# Patient Record
Sex: Female | Born: 1985 | Race: White | Hispanic: No | Smoking: Former smoker
Health system: Southern US, Community
[De-identification: ages and names within clinical notes are randomized; demographics above are authoritative.]

## PROBLEM LIST (undated history)

## (undated) DIAGNOSIS — F329 Major depressive disorder, single episode, unspecified: Secondary | ICD-10-CM

## (undated) DIAGNOSIS — F32A Depression, unspecified: Secondary | ICD-10-CM

## (undated) DIAGNOSIS — K819 Cholecystitis, unspecified: Secondary | ICD-10-CM

## (undated) DIAGNOSIS — N200 Calculus of kidney: Secondary | ICD-10-CM

## (undated) DIAGNOSIS — M1990 Unspecified osteoarthritis, unspecified site: Secondary | ICD-10-CM

## (undated) DIAGNOSIS — R519 Headache, unspecified: Secondary | ICD-10-CM

## (undated) DIAGNOSIS — F319 Bipolar disorder, unspecified: Secondary | ICD-10-CM

## (undated) DIAGNOSIS — M5430 Sciatica, unspecified side: Secondary | ICD-10-CM

## (undated) HISTORY — PX: BACK SURGERY: SHX140

## (undated) HISTORY — PX: ANKLE ARTHROSCOPY W/ OPEN REPAIR: SHX1145

## (undated) HISTORY — PX: OTHER SURGICAL HISTORY: SHX169

## (undated) HISTORY — PX: TUBAL LIGATION: SHX77

---
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
## 2004-02-09 ENCOUNTER — Inpatient Hospital Stay (HOSPITAL_COMMUNITY): Admission: AD | Admit: 2004-02-09 | Discharge: 2004-02-10 | Payer: Self-pay

## 2004-03-19 ENCOUNTER — Inpatient Hospital Stay (HOSPITAL_COMMUNITY): Admission: AD | Admit: 2004-03-19 | Discharge: 2004-03-19 | Payer: Self-pay | Admitting: Obstetrics and Gynecology

## 2004-03-20 ENCOUNTER — Inpatient Hospital Stay (HOSPITAL_COMMUNITY): Admission: AD | Admit: 2004-03-20 | Discharge: 2004-03-22 | Payer: Self-pay | Admitting: Obstetrics and Gynecology

## 2004-05-26 ENCOUNTER — Other Ambulatory Visit: Admission: RE | Admit: 2004-05-26 | Discharge: 2004-05-26 | Payer: Self-pay | Admitting: Obstetrics and Gynecology

## 2004-11-26 ENCOUNTER — Other Ambulatory Visit: Admission: RE | Admit: 2004-11-26 | Discharge: 2004-11-26 | Payer: Self-pay | Admitting: Obstetrics & Gynecology

## 2004-12-29 ENCOUNTER — Inpatient Hospital Stay (HOSPITAL_COMMUNITY): Admission: AD | Admit: 2004-12-29 | Discharge: 2005-01-04 | Payer: Self-pay | Admitting: Obstetrics and Gynecology

## 2005-01-02 ENCOUNTER — Encounter (INDEPENDENT_AMBULATORY_CARE_PROVIDER_SITE_OTHER): Payer: Self-pay | Admitting: Specialist

## 2008-06-25 ENCOUNTER — Inpatient Hospital Stay (HOSPITAL_COMMUNITY): Admission: AD | Admit: 2008-06-25 | Discharge: 2008-06-25 | Payer: Self-pay | Admitting: Obstetrics & Gynecology

## 2008-08-23 ENCOUNTER — Inpatient Hospital Stay (HOSPITAL_COMMUNITY): Admission: AD | Admit: 2008-08-23 | Discharge: 2008-08-24 | Payer: Self-pay | Admitting: Obstetrics and Gynecology

## 2008-09-15 ENCOUNTER — Encounter (INDEPENDENT_AMBULATORY_CARE_PROVIDER_SITE_OTHER): Payer: Self-pay | Admitting: Obstetrics and Gynecology

## 2008-09-15 ENCOUNTER — Inpatient Hospital Stay (HOSPITAL_COMMUNITY): Admission: AD | Admit: 2008-09-15 | Discharge: 2008-09-18 | Payer: Self-pay | Admitting: Obstetrics and Gynecology

## 2011-04-05 NOTE — Op Note (Signed)
NAMEROWENE, SUTO NO.:  [id number]   MEDICAL RECORD NO.:  [id number]          PATIENT TYPE:  INP   LOCATION:  9109                          FACILITY:  [facility]   PHYSICIAN:  Malva Limes, M.D.    DATE OF BIRTH:  1986-06-07   DATE OF PROCEDURE:  09/15/2008  DATE OF DISCHARGE:                               OPERATIVE REPORT   PREOPERATIVE DIAGNOSES:  1. Twin intrauterine pregnancy at 36-4/7th weeks.  2. Breech-breech presentation.  3. Premature rupture of membranes.  4. Abnormal alpha-fetoprotein with normal ultrasound.  5. The patient desires bilateral tubal ligation.   POSTOPERATIVE DIAGNOSES:  1. Twin intrauterine pregnancy at 36-4/7th weeks.  2. Breech-breech presentation.  3. Premature rupture of membranes.  4. Abnormal alpha-fetoprotein with normal ultrasound.  5. The patient desires bilateral tubal ligation.   PROCEDURES:  1. Primary low-transverse cesarean section.  2. Bilateral tubal ligation, Pomeroy procedure.   SURGEON:  Malva Limes, MD.   ANESTHESIA:  Spinal.   ANTIBIOTIC:  Ancef 1 g.   DRAINS:  Foley bedside drainage.   ESTIMATED BLOOD LOSS:  900 mL.   SPECIMENS:  1. Portion of right and left fallopian tube sent to Pathology.  2. Placenta sent to Pathology.   DESCRIPTION OF PROCEDURE:  The patient was taken to the operating room  where a spinal anesthetic was administered without difficulty.  She was  then placed in dorsal supine position with a left lateral tilt.  The  patient was prepped and draped in the usual fashion for this procedure.  A Pfannenstiel incision was made 2 cm above the pubic symphysis.  On  entering the abdominal cavity, the bladder flap was taken down with  sharp dissection.  A low-transverse uterine incision was made in the  midline with the Metzenbaum scissors.  Amniotic fluid was noted to be  clear.  Twin A was born in the breech presentation.  On delivery, the  head, the oropharynx, and nostrils were bulb  suctioned.  The cord was  doubly clamped and cut and the infant was handed to awaiting NICU team.  During these, the sac was then ruptured.  The fluid again was normal.  Twin B was delivered in the breech presentation.  On delivery of the  head, the oropharynx and nostrils were bulb suctioned.  The cord was  doubly clamped and cut and the infant was handed to waiting NICU team.  Placenta was then manually removed.  The uterus was exteriorized.  The  uterine cavity was wiped with a wet lap and examined.  The uterine  incision was closed in a single layer of 0 Monocryl in a running locking  fashion.  Bladder flap was closed with 2-0 Monocryl in a running  fashion.  At this point, the right fallopian tube was grasped in the  isthmic portion with a Babcock.  A 2-3-cm knuckle was doubly ligated  with 0 gut suture.  The knuckle was excised.  Both ostia were  visualized.  Hemostasis appeared to be adequate.  A similar procedure  was performed on the opposite side.  Uterus was then placed  back in the  abdominal cavity.  Hemostasis again was checked and found to be  adequate.  The parietal peritoneum and rectus muscles were  reapproximated in the midline using 2-0 Monocryl in a running fashion.  The fascia was closed using 0 Monocryl suture in a running fashion.  The  subcuticular tissue was made hemostatic with the Bovie.  Stainless steel  clips were used to close the skin.  The patient tolerated the procedure  well.  She was taken to recovery room in stable condition.  Instrument  counts were correct x2.           ______________________________  Malva Limes, M.D.     MA/MEDQ  D:  09/15/2008  T:  09/15/2008  Job:  [id number]
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
## 2011-04-08 NOTE — Discharge Summary (Signed)
[name], [name] NO.:  [id number]   MEDICAL RECORD NO.:  [id number]          PATIENT TYPE:  INP   LOCATION:  9109                          FACILITY:  [facility]   PHYSICIAN:  Jill Cardenas, M.D. DATE OF BIRTH:  May 31, 1986   DATE OF ADMISSION:  09/15/2008  DATE OF DISCHARGE:  09/18/2008                               DISCHARGE SUMMARY   FINAL DIAGNOSES:  Twin gestation at 36-4/7th weeks' gestation, breech-  breech presentation, preterm premature rupture of membranes, the patient  desires bilateral tubal ligation an abnormal alpha-fetoprotein during  her second trimester with a normal anatomy ultrasound.   PROCEDURE:  Primary low transverse cesarean section and bilateral tubal  ligation using the Pomeroy procedure.   SURGEON:  Malva Limes, MD   COMPLICATIONS:  None.   This 25 year old, G3, P1-1-0-2, presents at 36-4/7th weeks' gestation to  the [facility] complaining of preterm premature rupture of  membranes.  The patient's antepartum course was complicated by this  known twin gestation.  She did have a history of preterm labor and  preterm premature rupture of membranes with her last pregnancy, and  therefore had been on 17-hydroxyprogesterone this pregnancy starting  round 16 weeks' gestation.  The patient also had an abnormal AFP result  around 15 weeks' gestation, but did have a level II ultrasound of the  perinatal which showed normal anatomy.  Group B Strep status was unknown  at this time, and the patient was admitted for a cesarean section  secondary to a known breech-breech presentation.  The patient was taken  to the operating room on September 15, 2008, by Dr. Malva Limes where a  primary low transverse cesarean section was performed with the delivery  of twin A female infant weighing 5 pounds 0 ounces with Apgars of 8 and 9,  then delivery of twin B, I do not see the second twin's Apgars.  Procedure went without complications.  The patient  still expressed her  desire for permanent sterilization which was performed using the Pomeroy  procedure.  The patient's postoperative course was benign without any  significant fevers.  She did want the babies to be circumcised, but this  will take place later at the pediatric office.  By postoperative day #3,  the patient was felt ready for discharge.  She was sent home on a  regular diet, told to decrease activities, told to continue her vitamins  and her iron supplement, was given Percocet 1-2 every 4-6 hours as  needed for pain, was to follow up in our office in 4 weeks for her  postpartum check.  Instructions and precautions were reviewed with the  patient.   LABORATORY DATA ON DISCHARGE:  The patient had a hemoglobin of 9.2,  white blood cell count of 16.9, and platelets of 228,000.      Jill Cardenas, Jill Cardenas.      Jill Cardenas, M.D.  Electronically Signed    MB/MEDQ  D:  10/21/2008  T:  10/22/2008  Job:  [id number]

## 2011-04-08 NOTE — Discharge Summary (Signed)
Jill Cardenas, Jill Cardenas NO.:  [id number]   MEDICAL RECORD NO.:  [id number]          PATIENT TYPE:  INP   LOCATION:  9317                          FACILITY:  [facility]   PHYSICIAN:  Ilda Mori, M.D.   DATE OF BIRTH:  04/22/1986   DATE OF ADMISSION:  12/29/2004  DATE OF DISCHARGE:  01/04/2005                                 DISCHARGE SUMMARY   DISCHARGE DIAGNOSES:  1.  Intrauterine pregnancy at 33-1/7 weeks' gestation.  2.  Preterm premature rupture of membranes.  3.  Spontaneous vaginal delivery of a 4 pound 1-1/2 ounce female infant with      Apgar's of 7 and 9.   COMPLICATIONS:  None.   HISTORY OF PRESENT ILLNESS:  This 25 year old, G2, P1 presents at 33-1/7  weeks' gestation with preterm premature rupture of membranes.  This was  confirmed in maternity admissions.  The patient's antepartum course up to  this point had been uncomplicated.  A Group B Streptococcus culture was  obtained and started on penicillin for prophylaxis.  An ultrasound was  ordered for fetal position AFI.  Betamethasone protocol was begun.  The  patient was admitted to the hospital.  There was no evidence of labor.  She  was continued to be monitored for any signs of infection and the need to  induce labor.  The patient did receive her 2 days of betamethasone and her  Group B Streptococcus culture came back negative.  Ultrasound showed the  baby in vertex position.  The patient continued to be stable, but on  hospital day #5, the patient started to have contractions and feeling  crampy.  The exam was performed and the cervix was already 5 cm dilated and  -2 station.  She was contracting at this time every 2-3 minutes.  She was  transferred to labor and delivery for delivery.  She dilated to complete and  pushed for less than 15 minutes.  She had a spontaneous vaginal delivery of  a 4 pound 1-1/2 ounce female infant with Apgar's of 7 and 9.  Baby was brought  to the NICU secondary to gestational  age.  Delivery went without  complications.  The patient's postpartum course was benign without  significant fevers.  She did have an elevated white blood cell count.  The  baby was in the NICU, but was stable and the patient was felt ready for  discharge on postpartum day #2.  Her white blood cell count had dropped from  32,000 to 26,000 before discharge.   DIET:  She was sent home on a regular diet.   ACTIVITY:  Decrease activities.   SPECIAL INSTRUCTIONS:  She was told to continue her Vitamins.  She was to  call with questions, increased bleeding, pain or temperatures.   DISCHARGE MEDICATIONS:  1.  Motrin 600 mg one every 6 hours as needed for pain.  2.  Tylenol 650 mg every 4-6 hours as needed for pain.   FOLLOW UP:  Follow up in the office in 4-6 weeks.   DISCHARGE LABORATORY DATA AND X-RAY FINDINGS:  The patient had  a hemoglobin  of 11.7, white blood cell count 26,000.      MB/MEDQ  D:  02/02/2005  T:  02/02/2005  Job:  [id number]

## 2011-04-08 NOTE — Op Note (Signed)
NAMEMARDEL, GRUDZIEN NO.:  [id number]   MEDICAL RECORD NO.:  [id number]                   PATIENT TYPE:  INP   LOCATION:  2550                                 FACILITY:  [facility]   PHYSICIAN:  Rodney A. Chaney Malling, M.D.           DATE OF BIRTH:  1986-03-25   DATE OF PROCEDURE:  07/01/2003  DATE OF DISCHARGE:                                 OPERATIVE REPORT   PREOPERATIVE DIAGNOSIS:  Severely displaced bimalleolar fracture, right  ankle.   POSTOPERATIVE DIAGNOSIS:  Severely displaced bimalleolar fracture, right  ankle.   OPERATION:  Open reduction and internal fixation of bimalleolar fracture,  right ankle, using two cannulated screws, medial malleolus, and six-hole  side plate with 4 mm cortical screws, lateral malleolus, and AlloMatrix  injectable bone putty.   ANESTHESIA:  General.   SURGEON:  Rodney A. Chaney Malling, M.D.   ASSISTANT:  Richardean Canal, P.A.   DESCRIPTION OF PROCEDURE:  Patient placed on the operating table in supine  position with a pneumatic tourniquet about the right upper thigh.  After  satisfactory general anesthesia a bump was placed under the right hip.  The  right lower extremity was prepped with Duraprep and draped out in the usual  manner.  The leg and foot were then wrapped out with an Esmarch, the  tourniquet elevated.  An incision was made over the lateral malleolus  starting about 45 cm proximal to the fracture site and carried down to the  tip of the medial malleolus.  Dissection was carried down to the fibula.  Soft tissue was stripped off.  The fracture site was clearly seen.  There  were fragments of bone missing.  The fracture was then reduced and brought  out to length and a six-hole side plate was put in position with bone clamps  to hold this in place, and appropriate-length screws were applied.  Excellent reduction of the fracture of the fibula was achieved.  Since there  was a defect at the fracture site,  AlloMatrix injectable bone putty was  injected about the fracture site.   Attention then turned to the medial malleolus.  An incision made over the  medial malleolus from proximal to distal.  A self-retaining retractor was  put in position.  The fracture site was clearly seen.  The posterior tibial  tendon was seen, and this was protected.  The medial malleolus was reduced  in anatomic position, and two small guide pins were placed across the  fracture site to stabilize the fracture very nicely.  Throughout the  procedure.  This was checked with C-arm.  A cannulated reamer was placed  over the guide pin and drill holes were made, and the depth of the holes was  measured with a guide.  A 50 mm cannulated screw was then placed over the  guide pins, passed across the fracture into the distal tibia.  Again this  was  all checked with radiograph and anatomic reduction of the medial  malleolus was achieved.  Excellent fixation was also achieved.  __________  seated nicely in the mortise with no varus or valgus tilt.  Subcutaneous  tissue was then closed with 2-0 Vicryl, skin closed with stainless steel  staples.  A large bulky compression and a sugar tong cast applied.  The  patient returned to the recovery room in excellent condition.  Technically  this went extremely well.    DRAINS:  None.   COMPLICATIONS:  None.                                               Rodney A. Chaney Malling, M.D.    RAM/MEDQ  D:  07/01/2003  T:  07/02/2003  Job:  [id number]

## 2011-08-22 LAB — URINE MICROSCOPIC-ADD ON

## 2011-08-22 LAB — CBC
HCT: 33 — ABNORMAL LOW
MCHC: 33.3
MCHC: 33.9
Platelets: 228
Platelets: 278
RBC: 3.16 — ABNORMAL LOW
RDW: 13.5
WBC: 16.9 — ABNORMAL HIGH

## 2011-08-22 LAB — URINALYSIS, ROUTINE W REFLEX MICROSCOPIC
Glucose, UA: NEGATIVE
pH: 5.5

## 2013-01-01 ENCOUNTER — Encounter (HOSPITAL_COMMUNITY): Payer: Self-pay | Admitting: *Deleted

## 2013-01-01 ENCOUNTER — Emergency Department (HOSPITAL_COMMUNITY)
Admission: EM | Admit: 2013-01-01 | Discharge: 2013-01-01 | Disposition: A | Discharge status: Home / Self Care | Payer: Self-pay | Attending: Emergency Medicine | Admitting: Emergency Medicine

## 2013-01-01 ENCOUNTER — Emergency Department (HOSPITAL_COMMUNITY): Payer: Self-pay

## 2013-01-01 DIAGNOSIS — R319 Hematuria, unspecified: Secondary | ICD-10-CM | POA: Insufficient documentation

## 2013-01-01 DIAGNOSIS — F17200 Nicotine dependence, unspecified, uncomplicated: Secondary | ICD-10-CM | POA: Insufficient documentation

## 2013-01-01 DIAGNOSIS — Z3202 Encounter for pregnancy test, result negative: Secondary | ICD-10-CM | POA: Insufficient documentation

## 2013-01-01 DIAGNOSIS — N200 Calculus of kidney: Secondary | ICD-10-CM | POA: Insufficient documentation

## 2013-01-01 DIAGNOSIS — M25519 Pain in unspecified shoulder: Secondary | ICD-10-CM | POA: Insufficient documentation

## 2013-01-01 DIAGNOSIS — N289 Disorder of kidney and ureter, unspecified: Secondary | ICD-10-CM

## 2013-01-01 DIAGNOSIS — R1011 Right upper quadrant pain: Secondary | ICD-10-CM | POA: Insufficient documentation

## 2013-01-01 DIAGNOSIS — R109 Unspecified abdominal pain: Secondary | ICD-10-CM

## 2013-01-01 DIAGNOSIS — R110 Nausea: Secondary | ICD-10-CM | POA: Insufficient documentation

## 2013-01-01 HISTORY — DX: Cholecystitis, unspecified: K81.9

## 2013-01-01 LAB — CBC WITH DIFFERENTIAL/PLATELET
Basophils Absolute: 0 10*3/uL (ref 0.0–0.1)
Eosinophils Relative: 2 % (ref 0–5)
Lymphocytes Relative: 24 % (ref 12–46)
Lymphs Abs: 2.9 10*3/uL (ref 0.7–4.0)
MCV: 87.4 fL (ref 78.0–100.0)
Neutro Abs: 8.3 10*3/uL — ABNORMAL HIGH (ref 1.7–7.7)
Neutrophils Relative %: 68 % (ref 43–77)
Platelets: 300 10*3/uL (ref 150–400)
RBC: 4.85 MIL/uL (ref 3.87–5.11)
WBC: 12.2 10*3/uL — ABNORMAL HIGH (ref 4.0–10.5)

## 2013-01-01 LAB — URINE MICROSCOPIC-ADD ON

## 2013-01-01 LAB — COMPREHENSIVE METABOLIC PANEL
ALT: 10 U/L (ref 0–35)
AST: 16 U/L (ref 0–37)
Alkaline Phosphatase: 83 U/L (ref 39–117)
CO2: 23 mEq/L (ref 19–32)
Chloride: 103 mEq/L (ref 96–112)
GFR calc Af Amer: >90 mL/min (ref >90)
GFR calc non Af Amer: >90 mL/min (ref >90)
Glucose, Bld: 91 mg/dL (ref 70–99)
Potassium: 3.9 mEq/L (ref 3.5–5.1)
Sodium: 139 mEq/L (ref 135–145)
Total Bilirubin: 0.4 mg/dL (ref 0.3–1.2)

## 2013-01-01 LAB — URINALYSIS, ROUTINE W REFLEX MICROSCOPIC
Bilirubin Urine: NEGATIVE
Glucose, UA: NEGATIVE mg/dL
Protein, ur: NEGATIVE mg/dL
Urobilinogen, UA: 1 mg/dL (ref 0.0–1.0)

## 2013-01-01 LAB — PREGNANCY, URINE: Preg Test, Ur: NEGATIVE

## 2013-01-01 MED ORDER — CEPHALEXIN 500 MG PO CAPS: 500.0000 mg | ORAL_CAPSULE | Freq: Three times a day (TID) | ORAL | Status: DC

## 2013-01-01 MED ORDER — ONDANSETRON HCL 4 MG/2ML IJ SOLN
4.0000 mg | Freq: Once | INTRAMUSCULAR | Status: AC
  Administered 2013-01-01: 4 mg via INTRAVENOUS
  Filled 2013-01-01: qty 2

## 2013-01-01 MED ORDER — HYDROMORPHONE HCL PF 1 MG/ML IJ SOLN
1.0000 mg | Freq: Once | INTRAMUSCULAR | Status: AC
  Administered 2013-01-01: 1 mg via INTRAVENOUS
  Filled 2013-01-01: qty 1

## 2013-01-01 MED ORDER — ONDANSETRON 8 MG PO TBDP: 8.0000 mg | ORAL_TABLET | Freq: Three times a day (TID) | ORAL | Status: DC | PRN

## 2013-01-01 MED ORDER — OXYCODONE-ACETAMINOPHEN 5-325 MG PO TABS: 1.0000 | ORAL_TABLET | ORAL | Status: DC | PRN

## 2013-01-01 NOTE — ED Notes (Signed)
I gave the patient a half a cup of ice. 

## 2013-01-01 NOTE — ED Notes (Signed)
I gave the patient another warm blanket. 

## 2013-01-01 NOTE — ED Notes (Signed)
Pt up ambulatory to the bathroom at this time 

## 2013-01-01 NOTE — ED Provider Notes (Signed)
History     CSN: [id number]  Arrival date & time 01/01/13  1150   First MD Initiated Contact with Patient 01/01/13 1340      Chief Complaint  Patient presents with  . Abdominal Pain    (Consider location/radiation/quality/duration/timing/severity/associated sxs/prior treatment) HPI Patient with ruq pain and right shoulder pain 5 days.  Shoulder pain constant, stomach is pressure.  Nausea, no vomiting.  Worsens with eating, sitting up.  Some chills, no fever. No meds daily.  Took dramamine Friday with some help with nausea.  LMP 2 days ago, nml, no bcp.  Bilateral tubal ligation.  g3p4 Pain 7-8.  Past Medical History  Diagnosis Date  . Cholecystitis     Past Surgical History  Procedure Laterality Date  . Cesarian    . Ankle arthroscopy w/ open repair      No family history on file.  History  Substance Use Topics  . Smoking status: Current Every Day Smoker -- 1.00 packs/day    Types: Cigarettes  . Smokeless tobacco: Not on file  . Alcohol Use: No    OB History   Grav Para Term Preterm Abortions TAB SAB Ect Mult Living                  Review of Systems  All other systems reviewed and are negative.    Allergies  Latex  Home Medications  No current outpatient prescriptions on file.  BP 114/76  Pulse 83  Temp(Src) 98.2 F (36.8 C) (Oral)  Resp 20  SpO2 97%  LMP 12/30/2012  Physical Exam  Nursing note and vitals reviewed. Constitutional: She appears well-developed and well-nourished.  HENT:  Head: Normocephalic and atraumatic.  Eyes: Conjunctivae and EOM are normal. Pupils are equal, round, and reactive to light.  Neck: Normal range of motion. Neck supple.  Cardiovascular: Normal rate, regular rhythm, normal heart sounds and intact distal pulses.   Pulmonary/Chest: Effort normal and breath sounds normal.  Abdominal: Soft. Bowel sounds are normal. There is tenderness.  Tender ruq  Musculoskeletal: Normal range of motion.  Neurological: She is alert.   Skin: Skin is warm and dry.  Psychiatric: She has a normal mood and affect. Thought content normal.    ED Course  Procedures (including critical care time)  Labs Reviewed  CBC WITH DIFFERENTIAL - Abnormal; Notable for the following:    WBC 12.2 (*)    Neutro Abs 8.3 (*)    All other components within normal limits  URINALYSIS, ROUTINE W REFLEX MICROSCOPIC - Abnormal; Notable for the following:    APPearance CLOUDY (*)    Hgb urine dipstick LARGE (*)    All other components within normal limits  URINE MICROSCOPIC-ADD ON - Abnormal; Notable for the following:    Squamous Epithelial / LPF FEW (*)    All other components within normal limits  COMPREHENSIVE METABOLIC PANEL  LIPASE, BLOOD   No results found.   No diagnosis found.  Results for orders placed during the hospital encounter of 01/01/13  CBC WITH DIFFERENTIAL      Result Value Range   WBC 12.2 (*) 4.0 - 10.5 K/uL   RBC 4.85  3.87 - 5.11 MIL/uL   Hemoglobin 14.3  12.0 - 15.0 g/dL   HCT 13.0  86.5 - 78.4 %   MCV 87.4  78.0 - 100.0 fL   MCH 29.5  26.0 - 34.0 pg   MCHC 33.7  30.0 - 36.0 g/dL   RDW 69.6  29.5 - 28.4 %  Platelets 300  150 - 400 K/uL   Neutrophils Relative 68  43 - 77 %   Neutro Abs 8.3 (*) 1.7 - 7.7 K/uL   Lymphocytes Relative 24  12 - 46 %   Lymphs Abs 2.9  0.7 - 4.0 K/uL   Monocytes Relative 7  3 - 12 %   Monocytes Absolute 0.8  0.1 - 1.0 K/uL   Eosinophils Relative 2  0 - 5 %   Eosinophils Absolute 0.2  0.0 - 0.7 K/uL   Basophils Relative 0  0 - 1 %   Basophils Absolute 0.0  0.0 - 0.1 K/uL  COMPREHENSIVE METABOLIC PANEL      Result Value Range   Sodium 139  135 - 145 mEq/L   Potassium 3.9  3.5 - 5.1 mEq/L   Chloride 103  96 - 112 mEq/L   CO2 23  19 - 32 mEq/L   Glucose, Bld 91  70 - 99 mg/dL   BUN 10  6 - 23 mg/dL   Creatinine, Ser 7.84  0.50 - 1.10 mg/dL   Calcium 9.9  8.4 - 69.6 mg/dL   Total Protein 7.5  6.0 - 8.3 g/dL   Albumin 4.4  3.5 - 5.2 g/dL   AST 16  0 - 37 U/L   ALT 10  0  - 35 U/L   Alkaline Phosphatase 83  39 - 117 U/L   Total Bilirubin 0.4  0.3 - 1.2 mg/dL   GFR calc non Af Amer >90  >90 mL/min   GFR calc Af Amer >90  >90 mL/min  LIPASE, BLOOD      Result Value Range   Lipase 26  11 - 59 U/L  URINALYSIS, ROUTINE W REFLEX MICROSCOPIC      Result Value Range   Color, Urine YELLOW  YELLOW   APPearance CLOUDY (*) CLEAR   Specific Gravity, Urine 1.012  1.005 - 1.030   pH 6.0  5.0 - 8.0   Glucose, UA NEGATIVE  NEGATIVE mg/dL   Hgb urine dipstick LARGE (*) NEGATIVE   Bilirubin Urine NEGATIVE  NEGATIVE   Ketones, ur NEGATIVE  NEGATIVE mg/dL   Protein, ur NEGATIVE  NEGATIVE mg/dL   Urobilinogen, UA 1.0  0.0 - 1.0 mg/dL   Nitrite NEGATIVE  NEGATIVE   Leukocytes, UA NEGATIVE  NEGATIVE  URINE MICROSCOPIC-ADD ON      Result Value Range   Squamous Epithelial / LPF FEW (*) RARE   WBC, UA 0-2  <3 WBC/hpf   RBC / HPF 21-50  <3 RBC/hpf   Bacteria, UA RARE  RARE  PREGNANCY, URINE      Result Value Range   Preg Test, Ur NEGATIVE  NEGATIVE   Ct Abdomen Pelvis Wo Contrast  01/01/2013  *RADIOLOGY REPORT*  Clinical Data: Right abdominal pain radiating to the right shoulder.  Nausea and belching.  CT ABDOMEN AND PELVIS WITHOUT CONTRAST  Technique:  Multidetector CT imaging of the abdomen and pelvis was performed following the standard protocol without intravenous contrast.  Comparison: 01/01/2013  Findings: 0.6 cm hypodense lesion in segment 2 of the liver, probably a cyst.  Spleen, adrenal glands, and pancreas unremarkable noncontrast CT appearance. The gallbladder and biliary system appear unremarkable.  A 3 mm left kidney lower pole nonobstructive calculus observed. Possible 1 mm right kidney upper pole nonobstructive calculus. Subtle 8 mm hypodense lesion inferiorly in the right kidney, quite likely a cyst but technically nonspecific. No compelling CT findings of medullary nephrocalcinosis.  No hydronephrosis or  hydroureter.  No ureteral calculus or bladder calculus.   No pathologic retroperitoneal or porta hepatis adenopathy is identified.  No pathologic pelvic adenopathy is identified.  Uterine and ovarian contours unremarkable.  No free pelvic fluid. Enchondroma noted proximally in the left femur.  Transitional L5 vertebra noted.  Left paracentral disc protrusion suspected at L4- 5.  Appendix normal.  No perihepatic ascites or definite lesion along the right hemidiaphragm.  IMPRESSION:  1.  Nonobstructive 3 mm left kidney lower pole calculus.  No compelling CT findings for medullary nephrocalcinosis. 2.  Suspected 1 mm right kidney upper pole nonobstructive calculus. 3.  8 mm hypodense lesion in the right kidney lower pole is of uncertain significance.  It is not readily apparent on the prior ultrasound, and is only very faintly seen on today's CT scan. Conceivably this could represent a focus of inflammation. Correlate with urine analysis.  CT or MRI using dedicated renal protocol with contrast utilized for further characterization. 4.  Left paracentral disc protrusion at L4-5.  Please note the L5 vertebra is transitional.   Original Report Authenticated By: Gaylyn Rong, M.D.    US Abdomen Complete  01/01/2013  *RADIOLOGY REPORT*  Clinical Data:  Abdominal pain  ABDOMINAL ULTRASOUND COMPLETE  Comparison:  None.  Findings:  Gallbladder:  No gallstones, gallbladder wall thickening, or pericholecystic fluid. The sonographic Murphy's sign is negative.  Common Bile Duct:  Within normal limits in caliber. Measures 4.9 mm.  Liver: No focal mass lesion identified.  Within normal limits in parenchymal echogenicity.  IVC:  Appears normal.  Pancreas:  No abnormality identified.  Spleen:  Within normal limits in size and echotexture. Measures 9.4 cm in sagittal length.  Right kidney:  Normal in size.  Measures 11.5 cm in sagittal length.  There is increased echogenicity of the medullary pyramids throughout the right kidney.  There is no hydronephrosis or renal mass.  Left kidney:   Normal in size.  Measures 12.4 cm and sagittal length.  Increased echogenicity of the medullary pyramids throughout the left kidney.  3 mm hyperechoic structure with some shadowing in the lower pole of the left kidney is likely a small stone.  Abdominal Aorta:  No aneurysm identified.  IMPRESSION:  1.  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  No prior imaging studies of the kidneys are available for comparison.  Potential etiologies for medullary nephrocalcinosis include medullary sponge kidney, hyperparathyroidism, and renal tubular acidosis. Consider follow-up with nephrology. 2.  Probable nonobstructing 3 mm stone lower pole left kidney. 3.  The remainder of the abdominal ultrasound is within normal limits.   Original Report Authenticated By: Britta Mccreedy, M.D.   Patient with ruq pain without evidence of cholelithiasis or right ureteral colic.  Paient with hematuria but also menstruating and urine was clean catch.  Pain improved here with pain meds.  Patient does have left lower pole stone.  Plan antibiotics and pain meds.  Patient advised close follow up.  Discusse ct results of disc and kidney lesion.   MDM          Hilario Quarry, MD 01/01/13 [phone]

## 2013-01-01 NOTE — ED Notes (Signed)
I gave the patient a warm blanket. 

## 2013-01-01 NOTE — ED Notes (Signed)
This summer pt was dx with cholecystitis.  Last night she began experiencing RUQ pain that radiates to her R shoulder, pain increases esp after she eats.  Pt also c/o nausea and belching.

## 2013-01-01 NOTE — Discharge Instructions (Signed)
°  You have no evidence of gallstones.  You have kidney stones in both kidneys and blood in your urine.  Please take all antibiotics as prescribed and recheck with your doctor for follow up.  You also have an abnormality of the right kidney which should be assessed by your doctor and may need follow up with a specialist.   Abdominal Pain Many things can cause belly (abdominal) pain. Most times, the belly pain is not dangerous. The amount of belly pain does not tell how serious the problem may be. Many cases of belly pain can be watched and treated at home. HOME CARE   Do not take medicines that help you go poop (laxatives) unless told to by your doctor.  Only take medicine as told by your doctor.  Eat or drink as told by your doctor. Your doctor will tell you if you should be on a special diet. GET HELP RIGHT AWAY IF:   The pain does not go away.  You have a fever.  You keep throwing up (vomiting).  The pain changes and is only in the right or left part of the belly.  You have bloody or tarry looking poop. MAKE SURE YOU:   Understand these instructions.  Will watch your condition.  Will get help right away if you are not doing well or get worse. Document Released: 04/25/2008 Document Revised: 01/30/2012 Document Reviewed: 11/23/2009 [facility] Patient Information 2013 [place], [place].

## 2013-01-01 NOTE — ED Notes (Signed)
Pt c/o nausea Ray MD notified. zofran and 1/2 cup of ice chips given

## 2014-11-03 ENCOUNTER — Emergency Department (HOSPITAL_COMMUNITY)
Admission: EM | Admit: 2014-11-03 | Discharge: 2014-11-04 | Disposition: A | Discharge status: Home / Self Care | Payer: Medicaid Other | Attending: Emergency Medicine | Admitting: Emergency Medicine

## 2014-11-03 ENCOUNTER — Encounter (HOSPITAL_COMMUNITY): Payer: Self-pay | Admitting: Physical Medicine and Rehabilitation

## 2014-11-03 ENCOUNTER — Emergency Department (HOSPITAL_COMMUNITY): Payer: Medicaid Other

## 2014-11-03 DIAGNOSIS — M5116 Intervertebral disc disorders with radiculopathy, lumbar region: Secondary | ICD-10-CM | POA: Insufficient documentation

## 2014-11-03 DIAGNOSIS — R29898 Other symptoms and signs involving the musculoskeletal system: Secondary | ICD-10-CM

## 2014-11-03 DIAGNOSIS — Z79899 Other long term (current) drug therapy: Secondary | ICD-10-CM | POA: Insufficient documentation

## 2014-11-03 DIAGNOSIS — Z91040 Latex allergy status: Secondary | ICD-10-CM | POA: Insufficient documentation

## 2014-11-03 DIAGNOSIS — R000 Tachycardia, unspecified: Secondary | ICD-10-CM | POA: Insufficient documentation

## 2014-11-03 DIAGNOSIS — Z8719 Personal history of other diseases of the digestive system: Secondary | ICD-10-CM | POA: Diagnosis not present

## 2014-11-03 DIAGNOSIS — Z3202 Encounter for pregnancy test, result negative: Secondary | ICD-10-CM | POA: Insufficient documentation

## 2014-11-03 DIAGNOSIS — R531 Weakness: Secondary | ICD-10-CM | POA: Diagnosis not present

## 2014-11-03 DIAGNOSIS — Z8659 Personal history of other mental and behavioral disorders: Secondary | ICD-10-CM | POA: Diagnosis not present

## 2014-11-03 DIAGNOSIS — Z720 Tobacco use: Secondary | ICD-10-CM | POA: Diagnosis not present

## 2014-11-03 DIAGNOSIS — M5416 Radiculopathy, lumbar region: Secondary | ICD-10-CM

## 2014-11-03 DIAGNOSIS — M545 Low back pain: Secondary | ICD-10-CM | POA: Diagnosis present

## 2014-11-03 DIAGNOSIS — M5126 Other intervertebral disc displacement, lumbar region: Secondary | ICD-10-CM

## 2014-11-03 HISTORY — DX: Major depressive disorder, single episode, unspecified: F32.9

## 2014-11-03 HISTORY — DX: Depression, unspecified: F32.A

## 2014-11-03 LAB — BASIC METABOLIC PANEL
ANION GAP: 16 — AB (ref 5–15)
BUN: 8 mg/dL (ref 6–23)
CHLORIDE: 101 meq/L (ref 96–112)
CO2: 20 mEq/L (ref 19–32)
Calcium: 9.4 mg/dL (ref 8.4–10.5)
Creatinine, Ser: 0.5 mg/dL (ref 0.50–1.10)
GFR calc Af Amer: >90 mL/min (ref >90)
Glucose, Bld: 127 mg/dL — ABNORMAL HIGH (ref 70–99)
POTASSIUM: 4.4 meq/L (ref 3.7–5.3)
SODIUM: 137 meq/L (ref 137–147)

## 2014-11-03 LAB — URINALYSIS, ROUTINE W REFLEX MICROSCOPIC
Bilirubin Urine: NEGATIVE
Glucose, UA: NEGATIVE mg/dL
Hgb urine dipstick: NEGATIVE
Ketones, ur: NEGATIVE mg/dL
Nitrite: NEGATIVE
PROTEIN: NEGATIVE mg/dL
SPECIFIC GRAVITY, URINE: 1.012 (ref 1.005–1.030)
UROBILINOGEN UA: 1 mg/dL (ref 0.0–1.0)
pH: 6 (ref 5.0–8.0)

## 2014-11-03 LAB — POC URINE PREG, ED: PREG TEST UR: NEGATIVE

## 2014-11-03 LAB — CBC WITH DIFFERENTIAL/PLATELET
Basophils Absolute: 0 10*3/uL (ref 0.0–0.1)
Basophils Relative: 0 % (ref 0–1)
Eosinophils Absolute: 0.6 10*3/uL (ref 0.0–0.7)
Eosinophils Relative: 2 % (ref 0–5)
HEMATOCRIT: 41.6 % (ref 36.0–46.0)
HEMOGLOBIN: 13.9 g/dL (ref 12.0–15.0)
LYMPHS PCT: 25 % (ref 12–46)
Lymphs Abs: 7.4 10*3/uL — ABNORMAL HIGH (ref 0.7–4.0)
MCH: 30.4 pg (ref 26.0–34.0)
MCHC: 33.4 g/dL (ref 30.0–36.0)
MCV: 91 fL (ref 78.0–100.0)
Monocytes Absolute: 2.7 10*3/uL — ABNORMAL HIGH (ref 0.1–1.0)
Monocytes Relative: 9 % (ref 3–12)
NEUTROS ABS: 19 10*3/uL — AB (ref 1.7–7.7)
NEUTROS PCT: 64 % (ref 43–77)
Platelets: 432 10*3/uL — ABNORMAL HIGH (ref 150–400)
RBC: 4.57 MIL/uL (ref 3.87–5.11)
RDW: 13.7 % (ref 11.5–15.5)
WBC: 29.7 10*3/uL — AB (ref 4.0–10.5)

## 2014-11-03 LAB — URINE MICROSCOPIC-ADD ON

## 2014-11-03 MED ORDER — LORAZEPAM 2 MG/ML IJ SOLN
1.0000 mg | Freq: Once | INTRAMUSCULAR | Status: AC
  Administered 2014-11-03: 1 mg via INTRAVENOUS
  Filled 2014-11-03: qty 1

## 2014-11-03 MED ORDER — ONDANSETRON HCL 4 MG/2ML IJ SOLN
4.0000 mg | Freq: Once | INTRAMUSCULAR | Status: AC
  Administered 2014-11-03: 4 mg via INTRAVENOUS

## 2014-11-03 MED ORDER — ONDANSETRON HCL 4 MG/2ML IJ SOLN
INTRAMUSCULAR | Status: AC
  Filled 2014-11-03: qty 2

## 2014-11-03 MED ORDER — HYDROMORPHONE HCL 1 MG/ML IJ SOLN
1.0000 mg | Freq: Once | INTRAMUSCULAR | Status: AC
  Administered 2014-11-03: 1 mg via INTRAVENOUS
  Filled 2014-11-03: qty 1

## 2014-11-03 MED ORDER — SODIUM CHLORIDE 0.9 % IV BOLUS (SEPSIS)
1000.0000 mL | Freq: Once | INTRAVENOUS | Status: AC
  Administered 2014-11-03: 1000 mL via INTRAVENOUS

## 2014-11-03 MED ORDER — HYDROMORPHONE HCL 1 MG/ML IJ SOLN
2.0000 mg | Freq: Once | INTRAMUSCULAR | Status: AC
  Administered 2014-11-03: 2 mg via INTRAMUSCULAR
  Filled 2014-11-03: qty 2

## 2014-11-03 MED ORDER — ONDANSETRON 4 MG PO TBDP
8.0000 mg | ORAL_TABLET | Freq: Once | ORAL | Status: AC
  Administered 2014-11-03: 8 mg via ORAL
  Filled 2014-11-03: qty 2

## 2014-11-03 NOTE — ED Provider Notes (Signed)
CSN: [id number]     Arrival date & time 11/03/14  1647 History   First MD Initiated Contact with Patient 11/03/14 1811     Chief Complaint  Patient presents with  . Back Pain  . Leg Pain     (Consider location/radiation/quality/duration/timing/severity/associated sxs/prior Treatment) HPI   Patient present with two month of low back pain with radiation into the left leg.  Occasional tingling and constant weakness.  Has been seen by PCP in [place] [employer] and has been on prednisone for 15 days, gabapentin for 10 days and soma.  She fell 3 days ago when she stood up and "it was like my leg wasn't there."  Denies any injury to the back.  States the pain started gradually, began as pain in her left hip that began upon wakening.  Denies fevers, chills, abdominal pain, loss of control of bowel or bladder, saddle anesthesia, bowel, urinary, or vaginal complaints.   No hx CA, IVDU.  Does have family hx (father) DDD, who would like patient to be seen in follow up by Dr Venetia MaxonStern.   Past Medical History  Diagnosis Date  . Cholecystitis   . Depression    Past Surgical History  Procedure Laterality Date  . Cesarian    . Ankle arthroscopy w/ open repair     No family history on file. History  Substance Use Topics  . Smoking status: Current Every Day Smoker -- 1.00 packs/day    Types: Cigarettes  . Smokeless tobacco: Not on file  . Alcohol Use: No   OB History    No data available     Review of Systems  All other systems reviewed and are negative.     Allergies  Latex  Home Medications   Prior to Admission medications   Medication Sig Start Date End Date Taking? Authorizing Provider  cephALEXin (KEFLEX) 500 MG capsule Take 1 capsule (500 mg total) by mouth 3 (three) times daily. 01/01/13   Hilario Quarryanielle S Ray, MD  ondansetron (ZOFRAN ODT) 8 MG disintegrating tablet Take 1 tablet (8 mg total) by mouth every 8 (eight) hours as needed for nausea. 01/01/13   Hilario Quarryanielle S Ray, MD    oxyCODONE-acetaminophen (PERCOCET/ROXICET) 5-325 MG per tablet Take 1 tablet by mouth every 4 (four) hours as needed for pain. 01/01/13   Hilario Quarryanielle S Ray, MD   BP 146/92 mmHg  Pulse 127  Temp(Src) 98.2 F (36.8 C) (Oral)  Resp 18  Ht 5\' 2"  (1.575 m)  Wt 200 lb (90.719 kg)  BMI 36.57 kg/m2  SpO2 97% Physical Exam  Constitutional: She appears well-developed and well-nourished. No distress.  HENT:  Head: Normocephalic and atraumatic.  Neck: Neck supple.  Cardiovascular: Tachycardia present.   Pulmonary/Chest: Effort normal.  Abdominal: Soft. She exhibits no distension and no mass. There is no tenderness. There is no rebound and no guarding.  Musculoskeletal:       Back:  Spine no crepitus, or stepoffs. Lower extremities:  Strength 5/5 on right, 3-4/5 on left throughout, sensation intact but hypersensitive on left, distal pulses intact.     Neurological: She is alert.  Bilateral patellar tendon reflexes intact and equal bilaterally.  Pt able to stand, hunched over, and states she is able to walk but does not demonstrate.    Skin: She is not diaphoretic.  Nursing note and vitals reviewed.   ED Course  Procedures (including critical care time) Labs Review Labs Reviewed  CBC WITH DIFFERENTIAL - Abnormal; Notable for the following:  WBC 29.7 (*)    Platelets 432 (*)    Neutro Abs 19.0 (*)    Lymphs Abs 7.4 (*)    Monocytes Absolute 2.7 (*)    All other components within normal limits  BASIC METABOLIC PANEL - Abnormal; Notable for the following:    Glucose, Bld 127 (*)    Anion gap 16 (*)    All other components within normal limits  URINALYSIS, ROUTINE W REFLEX MICROSCOPIC - Abnormal; Notable for the following:    APPearance CLOUDY (*)    Leukocytes, UA SMALL (*)    All other components within normal limits  URINE MICROSCOPIC-ADD ON - Abnormal; Notable for the following:    Squamous Epithelial / LPF MANY (*)    Bacteria, UA FEW (*)    All other components within normal  limits  POC URINE PREG, ED    Imaging Review Mr Lumbar Spine Wo Contrast  11/03/2014   CLINICAL DATA:  Initial evaluation for low back pain, radiating into left leg for 2 weeks.  EXAM: MRI LUMBAR SPINE WITHOUT CONTRAST  TECHNIQUE: Multiplanar, multisequence MR imaging of the lumbar spine was performed. No intravenous contrast was administered.  COMPARISON:  None.  FINDINGS: There is transitional lumbosacral anatomy with sacralization of the L5 vertebral body. The L5-S1 intervertebral disc space is mildly rudimentary.  Mild dextroscoliosis of the lumbar spine present. No listhesis. Vertebral body heights preserved. Signal intensity within the vertebral body bone marrow is normal. No focal osseous lesion.  The conus medullaris terminates normally at the L1 level. Signal intensity within the visualized cord is normal. Nerve roots of the cauda equina are within normal limits.  Paraspinous soft tissues are within normal limits.  At T10-11 through L3-4, there is no disc bulge or disc protrusion. Intervertebral discs are well hydrated. No significant facet arthropathy. No canal or foraminal stenosis.  At L4-5: There is a large central disc protrusion indenting the thecal sac. The protruding disc measures approximately 21 x 8 mm. There is secondary moderate canal with severe bilateral lateral recess stenosis, left worse than right. The herniated disc appears to impinge upon the transiting L5 nerve roots bilaterally as they course through the lateral recesses, worse on the left. Mild bilateral facet hypertrophy present. Foramina remain widely patent.  At L5-S1: Rudimentary disc present. No disc bulge or disc protrusion. No canal or foraminal stenosis.  IMPRESSION: 1. Large central disc protrusion at L5-S1 with secondary severe bilateral lateral recess stenosis, left greater than right. 2. 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 Careful  correlation with imaging is recommended prior to any potential future surgical intervention. 3. Mild dextroscoliosis.   Electronically Signed   By: Rise MuBenjamin  McClintock M.D.   On: 11/03/2014 23:53     EKG Interpretation None       7:39 PM Pt still with persistent, extreme weakness in left lower extremity.  Will order MRI.   Leukocytosis likely due to 15 days of steroids.   12:08 AM Reviewed results with Dr Norlene Campbelltter.  Will discuss with neurosurgery.    12:18 AM Discussed pt with Dr Jordan LikesPool.  He will review MRI and call me back.    12:28 AM Dr Jordan LikesPool will be able to see pt in his office in the next day or two.  She may call the office tomorrow to check Dr Fredrich BirksStern's schedule if she prefers.  Dr Jordan LikesPool states she will not have emergency surgery overnight but will need scheduled surgery  soon.    MDM   Final diagnoses:  Lumbar radiculopathy  Left leg weakness  Lumbar herniated disc    Afebrile, nontoxic patient with low back pain with left sided radiculopathy and left leg weakness.  Found to have herniated disc (see MRI results for full details).  Discussed with Dr Jordan Likes who recommends follow up with him in his office in the next few days.  Will need scheduled surgery.   D/C home with percocet, robaxin, Rx for walker for support, neurosurgery follow up.   Discussed result, findings, treatment, and follow up  with patient.  Pt given return precautions.  Pt verbalizes understanding and agrees with plan.         Trixie Dredge, PA-C 11/04/14 0051  Trixie Dredge, PA-C 11/04/14 1610  Toy Cookey, MD 11/04/14 [phone]

## 2014-11-03 NOTE — ED Notes (Signed)
Patient transported to MRI 

## 2014-11-03 NOTE — ED Notes (Signed)
Pt presents to department for evaluation of L lower back pain radiating down L leg. Ongoing x2 months. 10/10 pain upon arrival. Becomes worse with movement. Pt is alert and oriented x4.

## 2014-11-04 ENCOUNTER — Encounter (HOSPITAL_COMMUNITY): Payer: Self-pay | Admitting: *Deleted

## 2014-11-04 ENCOUNTER — Other Ambulatory Visit: Payer: Self-pay | Admitting: Neurosurgery

## 2014-11-04 LAB — PATHOLOGIST SMEAR REVIEW: PATH REVIEW: REACTIVE

## 2014-11-04 MED ORDER — OXYCODONE-ACETAMINOPHEN 5-325 MG PO TABS
2.0000 | ORAL_TABLET | Freq: Once | ORAL | Status: AC
  Administered 2014-11-04: 2 via ORAL
  Filled 2014-11-04: qty 2

## 2014-11-04 MED ORDER — OXYCODONE-ACETAMINOPHEN 5-325 MG PO TABS: 1.0000 | ORAL_TABLET | ORAL | Status: DC | PRN

## 2014-11-04 MED ORDER — METHOCARBAMOL 750 MG PO TABS: 750.0000 mg | ORAL_TABLET | Freq: Four times a day (QID) | ORAL | Status: DC | PRN

## 2014-11-04 NOTE — Discharge Instructions (Signed)
Read the information below.  Use the prescribed medication as directed.  Please discuss all new medications with your pharmacist.  Do not take additional tylenol while taking the prescribed pain medication to avoid overdose.  You may return to the Emergency Department at any time for worsening condition or any new symptoms that concern you.   If you develop fevers, loss of control of bowel or bladder, complete weakness or numbness in your legs, or are unable to walk, return to the ER for a recheck.    Herniated Disk A herniated disk occurs when a disk in your spine bulges out too far. This condition is also called a ruptured disk or slipped disk. Your spine (backbone) is made up of bones called vertebrae. Between each pair of vertebrae is an oval disk with a soft, spongy center that acts as a shock absorber when you move. The spongy center is surrounded by a tough outer ring. When you have a herniated disk, the spongy center of the disk bulges out or ruptures through the outer ring. A herniated disk can press on a nerve between your vertebrae and cause pain. A herniated disk can occur anywhere in your back or neck area, but the lower back is the most common spot. CAUSES  In many cases, a herniated disk occurs just from getting older. As you age, the spongy insides of your disks tend to shrink and dry out. A herniated disk can result from gradual wear and tear. Injury or sudden strain can also cause a herniated disk.  RISK FACTORS Aging is the main risk factor for a herniated disk. Other risk factors include:  Being a man between the ages of 42 and 71 years.  Having a job that requires heavy lifting, bending, or twisting.  Having a job that requires long hours of driving.  Not getting enough exercise.  Being overweight.  Smoking. SIGNS AND SYMPTOMS  Signs and symptoms depend on which disk is herniated.  For a herniated disk in the lower back, you may have sharp pain in:  One part of your  leg, hip, or buttocks.  The back of your calf.  The top or sole of your foot (sciatica).   For a herniated disk in the neck, you may feel pain:  When you move your neck.  Near or over your shoulder blade.  That moves to your upper arm, forearm, or fingers.   You may also have muscle weakness. It may be hard to:  Lift your leg or arm.  Stand on your toes.  Squeeze tightly with one of your hands.  Other symptoms can include:  Numbness or tingling in the affected areas of your body.  Loss of bladder or bowel control. This is a rare but serious sign of a severe herniated disk in the lower back. DIAGNOSIS  Your health care provider will do a physical exam. During this exam, you may have to move certain body parts or assume various positions. For example, your health care provider may do the straight-leg test. This is a good way to test for a herniated disk in your lower back. In this test, the health care provider lifts your leg while you lie on your back. This is to see if you feel pain down your leg. Your health care provider will also check for numbness or loss of feeling.  Your health care provider will also check your:  Reflexes.  Muscle strength.  Posture.  Other tests may be done to help  in making a diagnosis. These may include:  An X-ray of the spine to rule out other causes of back pain.   Other imaging studies, such as an MRI or CT scan. This is to check whether the herniated disk is pressing on your spinal canal.  Electromyography (EMG). This test checks the nerves that control muscles. It is sometimes used to identify the specific area of nerve involvement.  TREATMENT  In many cases, herniated disk symptoms go away over a period of days or weeks. You will most likely be free of symptoms in 3-4 months. Treatment may include the following:  The initial treatment for a herniated disk is ashort period of rest.  Bed rest is often limited to 1 or 2 days.  Resting for too long delays recovery.  If you have a herniated disk in your lower back, you should avoid sitting as much as possible because sitting increases pressure on the disk.  Medicines. These may include:   Nonsteroidal anti-inflammatory drugs (NSAIDs).  Muscle relaxants for back spasms.  Narcotic pain medicine if your pain is very bad.   Steroid injections. You may need these along the involved nerve root to help control pain. The steroid is injected in the area of the herniated disk. It helps by reducing swelling around the disk.  Physical therapy. This may include exercises to strengthen the muscles that help support your spine.   You may need surgery if other treatments do not work.  HOME CARE INSTRUCTIONS Follow all your health care provider's instructions. These may include:  Take all medicines as directed by your health care provider.  Rest for 2 days and then start moving.  Do not sit or stand for long periods of time.  Maintain good posture when sitting and standing.  Avoid movements that cause pain, such as bending or lifting.  When you are able to start lifting things again:  EugeneBend with your knees.  Keep your back straight.  Hold heavy objects close to your body.  If you are overweight, ask your health care provider to help you start a weight-loss program.  When you are able to start exercising, ask your health care provider how much and what type of exercise is best for you.  Work with a physical therapist on stretching and strengthening exercises for your back.  Do not wear high-heeled shoes.  Do not sleep on your belly.  Do not smoke.  Keep all follow-up visits as directed by your health care provider. SEEK MEDICAL CARE IF:  You have back or neck pain that is not getting better after 4 weeks.  You have very bad pain in your back or neck.  You develop numbness, tingling, or weakness along with pain. SEEK IMMEDIATE MEDICAL CARE IF:    You have numbness, tingling, or weakness that makes you unable to use your arms or legs.  You lose control of your bladder or bowels.  You have dizziness or fainting.  You have shortness of breath.  MAKE SURE YOU:   Understand these instructions.  Will watch your condition.  Will get help right away if you are not doing well or get worse. Document Released: 11/04/2000 Document Revised: 03/24/2014 Document Reviewed: 10/11/2013 [facility] Patient Information 2015 [place], [place]. This information is not intended to replace advice given to you by your health care provider. Make sure you discuss any questions you have with your health care provider.  Lumbosacral Radiculopathy Lumbosacral radiculopathy is a pinched nerve or nerves in the low  back (lumbosacral area). When this happens you may have weakness in your legs and may not be able to stand on your toes. You may have pain going down into your legs. There may be difficulties with walking normally. There are many causes of this problem. Sometimes this may happen from an injury, or simply from arthritis or boney problems. It may also be caused by other illnesses such as diabetes. If there is no improvement after treatment, further studies may be done to find the exact cause. DIAGNOSIS  X-rays may be needed if the problems become long standing. Electromyograms may be done. This study is one in which the working of nerves and muscles is studied. HOME CARE INSTRUCTIONS   Applications of ice packs may be helpful. Ice can be used in a plastic bag with a towel around it to prevent frostbite to skin. This may be used every 2 hours for 20 to 30 minutes, or as needed, while awake, or as directed by your caregiver.  Only take over-the-counter or prescription medicines for pain, discomfort, or fever as directed by your caregiver.  If physical therapy was prescribed, follow your caregiver's directions. SEEK IMMEDIATE MEDICAL CARE IF:   You have  pain not controlled with medications.  You seem to be getting worse rather than better.  You develop increasing weakness in your legs.  You develop loss of bowel or bladder control.  You have difficulty with walking or balance, or develop clumsiness in the use of your legs.  You have a fever. MAKE SURE YOU:   Understand these instructions.  Will watch your condition.  Will get help right away if you are not doing well or get worse. Document Released: 11/07/2005 Document Revised: 01/30/2012 Document Reviewed: 06/27/2008 [facility] Patient Information 2015 [place], [place]. This information is not intended to replace advice given to you by your health care provider. Make sure you discuss any questions you have with your health care provider.

## 2014-11-04 NOTE — Progress Notes (Signed)
Pt denies any fever or chills. Seen in the ED last PM and WBC were 29,000. Note by the ED physician stated that it was probably due to pt's recent 10 day steroid use. I did call and talk with Erie NoeVanessa, Dr. Lindalou HosePool's scheduler to see if Dr. Jordan LikesPool was aware of the high white count.  She states she will give the message to Dr. Jordan LikesPool.

## 2014-11-05 ENCOUNTER — Ambulatory Visit (HOSPITAL_COMMUNITY): Payer: Medicaid Other

## 2014-11-05 ENCOUNTER — Encounter (HOSPITAL_COMMUNITY)
Admission: RE | Disposition: A | Discharge status: Home / Self Care | Payer: Self-pay | Source: Ambulatory Visit | Attending: Neurosurgery

## 2014-11-05 ENCOUNTER — Ambulatory Visit (HOSPITAL_COMMUNITY): Payer: Medicaid Other | Admitting: Anesthesiology

## 2014-11-05 ENCOUNTER — Ambulatory Visit (HOSPITAL_COMMUNITY)
Admission: RE | Admit: 2014-11-05 | Discharge: 2014-11-05 | Disposition: A | Discharge status: Home / Self Care | Payer: Medicaid Other | Source: Ambulatory Visit | Attending: Neurosurgery | Admitting: Neurosurgery

## 2014-11-05 ENCOUNTER — Encounter (HOSPITAL_COMMUNITY): Payer: Self-pay | Admitting: *Deleted

## 2014-11-05 DIAGNOSIS — F17210 Nicotine dependence, cigarettes, uncomplicated: Secondary | ICD-10-CM | POA: Diagnosis not present

## 2014-11-05 DIAGNOSIS — F329 Major depressive disorder, single episode, unspecified: Secondary | ICD-10-CM | POA: Insufficient documentation

## 2014-11-05 DIAGNOSIS — Z87442 Personal history of urinary calculi: Secondary | ICD-10-CM | POA: Insufficient documentation

## 2014-11-05 DIAGNOSIS — M5116 Intervertebral disc disorders with radiculopathy, lumbar region: Secondary | ICD-10-CM | POA: Diagnosis not present

## 2014-11-05 DIAGNOSIS — Z91040 Latex allergy status: Secondary | ICD-10-CM | POA: Diagnosis not present

## 2014-11-05 DIAGNOSIS — IMO0002 Reserved for concepts with insufficient information to code with codable children: Secondary | ICD-10-CM

## 2014-11-05 DIAGNOSIS — M5126 Other intervertebral disc displacement, lumbar region: Secondary | ICD-10-CM | POA: Diagnosis present

## 2014-11-05 HISTORY — DX: Calculus of kidney: N20.0

## 2014-11-05 HISTORY — PX: LUMBAR LAMINECTOMY/DECOMPRESSION MICRODISCECTOMY: SHX5026

## 2014-11-05 LAB — CBC
HEMATOCRIT: 40.9 % (ref 36.0–46.0)
HEMOGLOBIN: 13.7 g/dL (ref 12.0–15.0)
MCH: 30.6 pg (ref 26.0–34.0)
MCHC: 33.5 g/dL (ref 30.0–36.0)
MCV: 91.3 fL (ref 78.0–100.0)
Platelets: 335 10*3/uL (ref 150–400)
RBC: 4.48 MIL/uL (ref 3.87–5.11)
RDW: 13.9 % (ref 11.5–15.5)
WBC: 13.4 10*3/uL — AB (ref 4.0–10.5)

## 2014-11-05 LAB — DIFFERENTIAL
BASOS ABS: 0 10*3/uL (ref 0.0–0.1)
Basophils Relative: 0 % (ref 0–1)
Eosinophils Absolute: 0.4 10*3/uL (ref 0.0–0.7)
Eosinophils Relative: 3 % (ref 0–5)
LYMPHS ABS: 3.7 10*3/uL (ref 0.7–4.0)
LYMPHS PCT: 27 % (ref 12–46)
Monocytes Absolute: 1.2 10*3/uL — ABNORMAL HIGH (ref 0.1–1.0)
Monocytes Relative: 9 % (ref 3–12)
NEUTROS ABS: 8.1 10*3/uL — AB (ref 1.7–7.7)
Neutrophils Relative %: 61 % (ref 43–77)

## 2014-11-05 LAB — BASIC METABOLIC PANEL
Anion gap: 12 (ref 5–15)
BUN: 8 mg/dL (ref 6–23)
CO2: 25 meq/L (ref 19–32)
Calcium: 9.6 mg/dL (ref 8.4–10.5)
Chloride: 101 mEq/L (ref 96–112)
Creatinine, Ser: 0.59 mg/dL (ref 0.50–1.10)
GFR calc Af Amer: >90 mL/min (ref >90)
GFR calc non Af Amer: >90 mL/min (ref >90)
GLUCOSE: 87 mg/dL (ref 70–99)
POTASSIUM: 4.2 meq/L (ref 3.7–5.3)
Sodium: 138 mEq/L (ref 137–147)

## 2014-11-05 LAB — SURGICAL PCR SCREEN
MRSA, PCR: NEGATIVE
STAPHYLOCOCCUS AUREUS: POSITIVE — AB

## 2014-11-05 SURGERY — LUMBAR LAMINECTOMY/DECOMPRESSION MICRODISCECTOMY 1 LEVEL
Anesthesia: General | Site: Back | Laterality: Left

## 2014-11-05 MED ORDER — CYCLOBENZAPRINE HCL 10 MG PO TABS: 10.0000 mg | ORAL_TABLET | Freq: Three times a day (TID) | ORAL | Status: DC | PRN

## 2014-11-05 MED ORDER — SODIUM CHLORIDE 0.9 % IJ SOLN: 3.0000 mL | INTRAMUSCULAR | Status: DC | PRN

## 2014-11-05 MED ORDER — OXYCODONE-ACETAMINOPHEN 5-325 MG PO TABS
1.0000 | ORAL_TABLET | ORAL | Status: DC | PRN
Start: 2014-11-05 — End: 2014-11-05
  Administered 2014-11-05 (×2): 2 via ORAL
  Filled 2014-11-05 (×2): qty 2

## 2014-11-05 MED ORDER — CEFAZOLIN SODIUM-DEXTROSE 2-3 GM-% IV SOLR
INTRAVENOUS | Status: AC
  Administered 2014-11-05: 2 g via INTRAVENOUS
  Filled 2014-11-05: qty 50

## 2014-11-05 MED ORDER — NEOSTIGMINE METHYLSULFATE 10 MG/10ML IV SOLN
INTRAVENOUS | Status: AC
  Filled 2014-11-05: qty 3

## 2014-11-05 MED ORDER — BUPIVACAINE HCL (PF) 0.25 % IJ SOLN
INTRAMUSCULAR | Status: DC | PRN
  Administered 2014-11-05: 10 mL

## 2014-11-05 MED ORDER — GLYCOPYRROLATE 0.2 MG/ML IJ SOLN
INTRAMUSCULAR | Status: DC | PRN
  Administered 2014-11-05: 0.6 mg via INTRAVENOUS

## 2014-11-05 MED ORDER — GLYCOPYRROLATE 0.2 MG/ML IJ SOLN
INTRAMUSCULAR | Status: AC
  Filled 2014-11-05: qty 3

## 2014-11-05 MED ORDER — OXYCODONE HCL 5 MG PO TABS
ORAL_TABLET | ORAL | Status: AC
  Filled 2014-11-05: qty 1

## 2014-11-05 MED ORDER — ROCURONIUM BROMIDE 50 MG/5ML IV SOLN
INTRAVENOUS | Status: AC
  Filled 2014-11-05: qty 1

## 2014-11-05 MED ORDER — CEFAZOLIN SODIUM 1-5 GM-% IV SOLN
1.0000 g | Freq: Three times a day (TID) | INTRAVENOUS | Status: DC
  Administered 2014-11-05: 1 g via INTRAVENOUS
  Filled 2014-11-05 (×2): qty 50

## 2014-11-05 MED ORDER — MENTHOL 3 MG MT LOZG: 1.0000 | LOZENGE | OROMUCOSAL | Status: DC | PRN

## 2014-11-05 MED ORDER — FENTANYL CITRATE 0.05 MG/ML IJ SOLN
INTRAMUSCULAR | Status: AC
  Filled 2014-11-05: qty 5

## 2014-11-05 MED ORDER — LIDOCAINE HCL (CARDIAC) 20 MG/ML IV SOLN
INTRAVENOUS | Status: AC
  Filled 2014-11-05: qty 5

## 2014-11-05 MED ORDER — KETOROLAC TROMETHAMINE 30 MG/ML IJ SOLN
INTRAMUSCULAR | Status: DC | PRN
  Administered 2014-11-05: 30 mg via INTRAVENOUS

## 2014-11-05 MED ORDER — PROPOFOL 10 MG/ML IV BOLUS
INTRAVENOUS | Status: AC
  Filled 2014-11-05: qty 20

## 2014-11-05 MED ORDER — MIDAZOLAM HCL 5 MG/5ML IJ SOLN
INTRAMUSCULAR | Status: DC | PRN
  Administered 2014-11-05: 2 mg via INTRAVENOUS

## 2014-11-05 MED ORDER — MUPIROCIN 2 % EX OINT
1.0000 "application " | TOPICAL_OINTMENT | Freq: Once | CUTANEOUS | Status: AC
  Administered 2014-11-05: 1 via TOPICAL
  Filled 2014-11-05: qty 22

## 2014-11-05 MED ORDER — CARISOPRODOL 350 MG PO TABS
350.0000 mg | ORAL_TABLET | Freq: Two times a day (BID) | ORAL | Status: DC
  Administered 2014-11-05: 350 mg via ORAL
  Filled 2014-11-05: qty 1

## 2014-11-05 MED ORDER — OXYCODONE HCL 5 MG PO TABS
5.0000 mg | ORAL_TABLET | Freq: Once | ORAL | Status: AC | PRN
  Administered 2014-11-05: 5 mg via ORAL

## 2014-11-05 MED ORDER — FENTANYL CITRATE 0.05 MG/ML IJ SOLN
INTRAMUSCULAR | Status: AC
  Filled 2014-11-05: qty 2

## 2014-11-05 MED ORDER — THROMBIN 5000 UNITS EX SOLR
CUTANEOUS | Status: DC | PRN
  Administered 2014-11-05 (×2): 5000 [IU] via TOPICAL

## 2014-11-05 MED ORDER — SUCCINYLCHOLINE CHLORIDE 20 MG/ML IJ SOLN
INTRAMUSCULAR | Status: AC
  Filled 2014-11-05: qty 1

## 2014-11-05 MED ORDER — SENNA 8.6 MG PO TABS
1.0000 | ORAL_TABLET | Freq: Two times a day (BID) | ORAL | Status: DC
  Administered 2014-11-05: 8.6 mg via ORAL
  Filled 2014-11-05: qty 1

## 2014-11-05 MED ORDER — SODIUM CHLORIDE 0.9 % IV SOLN: 250.0000 mL | INTRAVENOUS | Status: DC

## 2014-11-05 MED ORDER — ONDANSETRON HCL 4 MG/2ML IJ SOLN: 4.0000 mg | Freq: Once | INTRAMUSCULAR | Status: DC | PRN

## 2014-11-05 MED ORDER — PROPOFOL 10 MG/ML IV BOLUS
INTRAVENOUS | Status: DC | PRN
  Administered 2014-11-05: 200 mg via INTRAVENOUS

## 2014-11-05 MED ORDER — ROCURONIUM BROMIDE 100 MG/10ML IV SOLN
INTRAVENOUS | Status: DC | PRN
  Administered 2014-11-05: 50 mg via INTRAVENOUS

## 2014-11-05 MED ORDER — MIDAZOLAM HCL 2 MG/2ML IJ SOLN
INTRAMUSCULAR | Status: AC
  Filled 2014-11-05: qty 2

## 2014-11-05 MED ORDER — PHENYLEPHRINE 40 MCG/ML (10ML) SYRINGE FOR IV PUSH (FOR BLOOD PRESSURE SUPPORT)
PREFILLED_SYRINGE | INTRAVENOUS | Status: AC
  Filled 2014-11-05: qty 10

## 2014-11-05 MED ORDER — PHENOL 1.4 % MT LIQD: 1.0000 | OROMUCOSAL | Status: DC | PRN

## 2014-11-05 MED ORDER — ARTIFICIAL TEARS OP OINT
TOPICAL_OINTMENT | OPHTHALMIC | Status: AC
  Filled 2014-11-05: qty 3.5

## 2014-11-05 MED ORDER — HEMOSTATIC AGENTS (NO CHARGE) OPTIME
TOPICAL | Status: DC | PRN
  Administered 2014-11-05: 1 via TOPICAL

## 2014-11-05 MED ORDER — ALUM & MAG HYDROXIDE-SIMETH 200-200-20 MG/5ML PO SUSP: 30.0000 mL | Freq: Four times a day (QID) | ORAL | Status: DC | PRN

## 2014-11-05 MED ORDER — FENTANYL CITRATE 0.05 MG/ML IJ SOLN
25.0000 ug | INTRAMUSCULAR | Status: DC | PRN
  Administered 2014-11-05: 25 ug via INTRAVENOUS
  Administered 2014-11-05: 50 ug via INTRAVENOUS
  Administered 2014-11-05: 25 ug via INTRAVENOUS

## 2014-11-05 MED ORDER — OXYCODONE HCL 5 MG/5ML PO SOLN: 5.0000 mg | Freq: Once | ORAL | Status: AC | PRN

## 2014-11-05 MED ORDER — SODIUM CHLORIDE 0.9 % IJ SOLN
INTRAMUSCULAR | Status: AC
  Filled 2014-11-05: qty 10

## 2014-11-05 MED ORDER — SODIUM CHLORIDE 0.9 % IR SOLN
Status: DC | PRN
  Administered 2014-11-05: 500 mL

## 2014-11-05 MED ORDER — FLUOXETINE HCL 20 MG PO CAPS
20.0000 mg | ORAL_CAPSULE | Freq: Every day | ORAL | Status: DC
  Filled 2014-11-05: qty 1

## 2014-11-05 MED ORDER — LACTATED RINGERS IV SOLN
INTRAVENOUS | Status: DC | PRN
  Administered 2014-11-05 (×2): via INTRAVENOUS

## 2014-11-05 MED ORDER — ONDANSETRON HCL 4 MG/2ML IJ SOLN
INTRAMUSCULAR | Status: AC
  Filled 2014-11-05: qty 2

## 2014-11-05 MED ORDER — ACETAMINOPHEN 325 MG PO TABS: 650.0000 mg | ORAL_TABLET | ORAL | Status: DC | PRN

## 2014-11-05 MED ORDER — ACETAMINOPHEN 650 MG RE SUPP: 650.0000 mg | RECTAL | Status: DC | PRN

## 2014-11-05 MED ORDER — KETOROLAC TROMETHAMINE 30 MG/ML IJ SOLN
30.0000 mg | Freq: Four times a day (QID) | INTRAMUSCULAR | Status: DC
  Administered 2014-11-05: 30 mg via INTRAVENOUS
  Filled 2014-11-05: qty 1

## 2014-11-05 MED ORDER — ARTIFICIAL TEARS OP OINT
TOPICAL_OINTMENT | OPHTHALMIC | Status: DC | PRN
Start: 2014-11-05 — End: 2014-11-05
  Administered 2014-11-05: 1 via OPHTHALMIC

## 2014-11-05 MED ORDER — ONDANSETRON HCL 4 MG/2ML IJ SOLN: 4.0000 mg | INTRAMUSCULAR | Status: DC | PRN

## 2014-11-05 MED ORDER — DEXAMETHASONE SODIUM PHOSPHATE 10 MG/ML IJ SOLN
INTRAMUSCULAR | Status: AC
  Filled 2014-11-05: qty 1

## 2014-11-05 MED ORDER — HYDROCODONE-ACETAMINOPHEN 5-325 MG PO TABS: 1.0000 | ORAL_TABLET | ORAL | Status: DC | PRN

## 2014-11-05 MED ORDER — SODIUM CHLORIDE 0.9 % IJ SOLN: 3.0000 mL | Freq: Two times a day (BID) | INTRAMUSCULAR | Status: DC

## 2014-11-05 MED ORDER — FENTANYL CITRATE 0.05 MG/ML IJ SOLN
INTRAMUSCULAR | Status: DC | PRN
  Administered 2014-11-05 (×2): 100 ug via INTRAVENOUS
  Administered 2014-11-05 (×3): 50 ug via INTRAVENOUS

## 2014-11-05 MED ORDER — HYDROMORPHONE HCL 1 MG/ML IJ SOLN: 0.5000 mg | INTRAMUSCULAR | Status: DC | PRN

## 2014-11-05 MED ORDER — NEOSTIGMINE METHYLSULFATE 10 MG/10ML IV SOLN
INTRAVENOUS | Status: DC | PRN
  Administered 2014-11-05: 4 mg via INTRAVENOUS

## 2014-11-05 MED ORDER — LIDOCAINE HCL (CARDIAC) 20 MG/ML IV SOLN
INTRAVENOUS | Status: DC | PRN
  Administered 2014-11-05: 80 mg via INTRAVENOUS

## 2014-11-05 MED ORDER — EPHEDRINE SULFATE 50 MG/ML IJ SOLN
INTRAMUSCULAR | Status: AC
  Filled 2014-11-05: qty 1

## 2014-11-05 MED ORDER — CEFAZOLIN SODIUM-DEXTROSE 2-3 GM-% IV SOLR: 2.0000 g | INTRAVENOUS | Status: DC

## 2014-11-05 MED ORDER — ONDANSETRON HCL 4 MG/2ML IJ SOLN
INTRAMUSCULAR | Status: DC | PRN
  Administered 2014-11-05: 4 mg via INTRAVENOUS

## 2014-11-05 MED ORDER — 0.9 % SODIUM CHLORIDE (POUR BTL) OPTIME
TOPICAL | Status: DC | PRN
  Administered 2014-11-05: 1000 mL

## 2014-11-05 MED ORDER — DEXAMETHASONE SODIUM PHOSPHATE 10 MG/ML IJ SOLN
10.0000 mg | INTRAMUSCULAR | Status: AC
  Administered 2014-11-05: 10 mg via INTRAVENOUS

## 2014-11-05 SURGICAL SUPPLY — 51 items
BAG DECANTER FOR FLEXI CONT (MISCELLANEOUS) ×2 IMPLANT
BENZOIN TINCTURE PRP APPL 2/3 (GAUZE/BANDAGES/DRESSINGS) ×2 IMPLANT
BLADE CLIPPER SURG (BLADE) IMPLANT
BRUSH SCRUB EZ PLAIN DRY (MISCELLANEOUS) ×2 IMPLANT
BUR CUTTER 7.0 ROUND (BURR) ×2 IMPLANT
CANISTER SUCT 3000ML (MISCELLANEOUS) ×2 IMPLANT
CONT SPEC 4OZ CLIKSEAL STRL BL (MISCELLANEOUS) ×2 IMPLANT
DECANTER SPIKE VIAL GLASS SM (MISCELLANEOUS) ×2 IMPLANT
DRAPE LAPAROTOMY 100X72X124 (DRAPES) ×2 IMPLANT
DRAPE MICROSCOPE LEICA (MISCELLANEOUS) ×2 IMPLANT
DRAPE POUCH INSTRU U-SHP 10X18 (DRAPES) ×2 IMPLANT
DRAPE PROXIMA HALF (DRAPES) IMPLANT
DRAPE SURG 17X23 STRL (DRAPES) ×4 IMPLANT
DURAPREP 26ML APPLICATOR (WOUND CARE) ×2 IMPLANT
ELECT REM PT RETURN 9FT ADLT (ELECTROSURGICAL) ×2
ELECTRODE REM PT RTRN 9FT ADLT (ELECTROSURGICAL) ×1 IMPLANT
GAUZE SPONGE 4X4 12PLY STRL (GAUZE/BANDAGES/DRESSINGS) ×2 IMPLANT
GAUZE SPONGE 4X4 16PLY XRAY LF (GAUZE/BANDAGES/DRESSINGS) IMPLANT
GLOVE BIOGEL PI IND STRL 7.0 (GLOVE) ×1 IMPLANT
GLOVE BIOGEL PI INDICATOR 7.0 (GLOVE) ×1
GLOVE ECLIPSE 9.0 STRL (GLOVE) IMPLANT
GLOVE EXAM NITRILE LRG STRL (GLOVE) IMPLANT
GLOVE EXAM NITRILE MD LF STRL (GLOVE) IMPLANT
GLOVE EXAM NITRILE XL STR (GLOVE) IMPLANT
GLOVE EXAM NITRILE XS STR PU (GLOVE) IMPLANT
GLOVE SS N UNI LF 6.5 STRL (GLOVE) ×6 IMPLANT
GLOVE SS N UNI LF STER SZ 9 (GLOVE) ×2 IMPLANT
GOWN STRL REUS W/ TWL LRG LVL3 (GOWN DISPOSABLE) ×1 IMPLANT
GOWN STRL REUS W/ TWL XL LVL3 (GOWN DISPOSABLE) ×1 IMPLANT
GOWN STRL REUS W/TWL 2XL LVL3 (GOWN DISPOSABLE) IMPLANT
GOWN STRL REUS W/TWL LRG LVL3 (GOWN DISPOSABLE) ×1
GOWN STRL REUS W/TWL XL LVL3 (GOWN DISPOSABLE) ×1
KIT BASIN OR (CUSTOM PROCEDURE TRAY) ×2 IMPLANT
KIT ROOM TURNOVER OR (KITS) ×2 IMPLANT
LIQUID BAND (GAUZE/BANDAGES/DRESSINGS) ×2 IMPLANT
NEEDLE HYPO 22GX1.5 SAFETY (NEEDLE) ×2 IMPLANT
NEEDLE SPNL 22GX3.5 QUINCKE BK (NEEDLE) ×2 IMPLANT
NS IRRIG 1000ML POUR BTL (IV SOLUTION) ×2 IMPLANT
PACK LAMINECTOMY NEURO (CUSTOM PROCEDURE TRAY) ×2 IMPLANT
PAD ARMBOARD 7.5X6 YLW CONV (MISCELLANEOUS) ×6 IMPLANT
RUBBERBAND STERILE (MISCELLANEOUS) ×4 IMPLANT
SPONGE SURGIFOAM ABS GEL SZ50 (HEMOSTASIS) ×2 IMPLANT
STRIP CLOSURE SKIN 1/2X4 (GAUZE/BANDAGES/DRESSINGS) ×2 IMPLANT
SUT VIC AB 2-0 CT1 18 (SUTURE) ×2 IMPLANT
SUT VIC AB 3-0 SH 8-18 (SUTURE) ×2 IMPLANT
SYR 20ML ECCENTRIC (SYRINGE) ×2 IMPLANT
TAPE CLOTH SURG 4X10 WHT LF (GAUZE/BANDAGES/DRESSINGS) ×2 IMPLANT
TAPE STRIPS DRAPE STRL (GAUZE/BANDAGES/DRESSINGS) ×2 IMPLANT
TOWEL OR 17X24 6PK STRL BLUE (TOWEL DISPOSABLE) ×2 IMPLANT
TOWEL OR 17X26 10 PK STRL BLUE (TOWEL DISPOSABLE) ×2 IMPLANT
WATER STERILE IRR 1000ML POUR (IV SOLUTION) ×2 IMPLANT

## 2014-11-05 NOTE — Transfer of Care (Signed)
Immediate Anesthesia Transfer of Care Note  Patient: Jill Cardenas  Procedure(s) Performed: Procedure(s) with comments: LUMBAR LAMINECTOMY/DECOMPRESSION MICRODISCECTOMY 1 LEVEL LUMBAR 4-5 (Left) - LUMBAR LAMINECTOMY/DECOMPRESSION MICRODISCECTOMY 1 LEVEL LUMBAR 4-5  Patient Location: PACU  Anesthesia Type:General  Level of Consciousness: awake  Airway & Oxygen Therapy: Patient Spontanous Breathing and Patient connected to face mask oxygen  Post-op Assessment: Report given to PACU RN and Post -op Vital signs reviewed and stable  Post vital signs: Reviewed and stable  Complications: No apparent anesthesia complications

## 2014-11-05 NOTE — Brief Op Note (Signed)
11/05/2014  9:59 AM  PATIENT:  Jill Cardenas  28 y.o. female  PRE-OPERATIVE DIAGNOSIS:  HNP  POST-OPERATIVE DIAGNOSIS:  herniated nucleus pulposus  PROCEDURE:  Procedure(s) with comments: LUMBAR LAMINECTOMY/DECOMPRESSION MICRODISCECTOMY 1 LEVEL LUMBAR 4-5 (Left) - LUMBAR LAMINECTOMY/DECOMPRESSION MICRODISCECTOMY 1 LEVEL LUMBAR 4-5  SURGEON:  Surgeon(s) and Role:    * Temple PaciniHenry A Immaculate Crutcher, MD - Primary  PHYSICIAN ASSISTANT:   ASSISTANTS: none   ANESTHESIA:   general  EBL:  Total I/O In: 1000 [I.V.:1000] Out: -   BLOOD ADMINISTERED:none  DRAINS: none   LOCAL MEDICATIONS USED:  MARCAINE     SPECIMEN:  No Specimen  DISPOSITION OF SPECIMEN:  N/A  COUNTS:  YES  TOURNIQUET:  * No tourniquets in log *  DICTATION: .Dragon Dictation  PLAN OF CARE: Admit for overnight observation  PATIENT DISPOSITION:  PACU - hemodynamically stable.   Delay start of Pharmacological VTE agent (>24hrs) due to surgical blood loss or risk of bleeding: yes

## 2014-11-05 NOTE — H&P (Signed)
Jill Cardenas is an 28 y.o. female.   Chief Complaint: Back and left leg pain HPI: 28 year old female with severe lower back pain with radiation into her left lower extremity. Symptoms progressive over the past 2 months. Symptoms associated with numbness and paresthesias. Patient with some weakness and has a history of falls. Workup demonstrates evidence of a very large left paracentral disc herniation D4-0 complicated by transitional anatomy at the lumbosacral junction. Patient presents now for lumbar microdiscectomy in hopes of improving her symptoms.  Past Medical History  Diagnosis Date  . Cholecystitis   . Depression   . Kidney stones     Past Surgical History  Procedure Laterality Date  . Cesarian    . Ankle arthroscopy w/ open repair Right   . Tubal ligation      Family History  Problem Relation Age of Onset  . Emphysema Mother    Social History:  reports that she has been smoking Cigarettes.  She has been smoking about 1.00 pack per day. She has never used smokeless tobacco. She reports that she does not drink alcohol or use illicit drugs.  Allergies:  Allergies  Allergen Reactions  . Latex Hives    Medications Prior to Admission  Medication Sig Dispense Refill  . carisoprodol (SOMA) 350 MG tablet Take 350 mg by mouth every 12 (twelve) hours.  0  . FLUoxetine (PROZAC) 20 MG capsule Take 20 mg by mouth daily.    . methocarbamol (ROBAXIN) 750 MG tablet Take 1 tablet (750 mg total) by mouth every 6 (six) hours as needed for muscle spasms (or pain). 20 tablet 0  . oxyCODONE-acetaminophen (PERCOCET/ROXICET) 5-325 MG per tablet Take 1-2 tablets by mouth every 4 (four) hours as needed for moderate pain or severe pain. 25 tablet 0    Results for orders placed or performed during the hospital encounter of 11/03/14 (from the past 48 hour(s))  POC Urine Pregnancy, ED (do NOT order at [facility])     Status: None   Collection Time: 11/03/14  7:38 PM  Result Value Ref Range   Preg  Test, Ur NEGATIVE NEGATIVE    Comment:        THE SENSITIVITY OF THIS METHODOLOGY IS >24 mIU/mL   CBC with Differential     Status: Abnormal   Collection Time: 11/03/14  7:50 PM  Result Value Ref Range   WBC 29.7 (H) 4.0 - 10.5 K/uL   RBC 4.57 3.87 - 5.11 MIL/uL   Hemoglobin 13.9 12.0 - 15.0 g/dL   HCT 41.6 36.0 - 46.0 %   MCV 91.0 78.0 - 100.0 fL   MCH 30.4 26.0 - 34.0 pg   MCHC 33.4 30.0 - 36.0 g/dL   RDW 13.7 11.5 - 15.5 %   Platelets 432 (H) 150 - 400 K/uL   Neutrophils Relative % 64 43 - 77 %   Lymphocytes Relative 25 12 - 46 %   Monocytes Relative 9 3 - 12 %   Eosinophils Relative 2 0 - 5 %   Basophils Relative 0 0 - 1 %   Neutro Abs 19.0 (H) 1.7 - 7.7 K/uL   Lymphs Abs 7.4 (H) 0.7 - 4.0 K/uL   Monocytes Absolute 2.7 (H) 0.1 - 1.0 K/uL   Eosinophils Absolute 0.6 0.0 - 0.7 K/uL   Basophils Absolute 0.0 0.0 - 0.1 K/uL   WBC Morphology ATYPICAL LYMPHOCYTES     Comment: TOXIC GRANULATION  Basic metabolic panel     Status: Abnormal   Collection  Time: 11/03/14  7:50 PM  Result Value Ref Range   Sodium 137 137 - 147 mEq/L   Potassium 4.4 3.7 - 5.3 mEq/L   Chloride 101 96 - 112 mEq/L   CO2 20 19 - 32 mEq/L   Glucose, Bld 127 (H) 70 - 99 mg/dL   BUN 8 6 - 23 mg/dL   Creatinine, Ser 0.50 0.50 - 1.10 mg/dL   Calcium 9.4 8.4 - 10.5 mg/dL   GFR calc non Af Amer >90 >90 mL/min   GFR calc Af Amer >90 >90 mL/min    Comment: (NOTE) The eGFR has been calculated using the CKD EPI equation. This calculation has not been validated in all clinical situations. eGFR's persistently <90 mL/min signify possible Chronic Kidney Disease.    Anion gap 16 (H) 5 - 15  Pathologist smear review     Status: None   Collection Time: 11/03/14  7:50 PM  Result Value Ref Range   Path Review Neutrophilia and reactive     Comment: appearing lymphocytes. Reviewed by Lennox Solders Lyndon Code, M.D. 11/04/14   Urinalysis, Routine w reflex microscopic     Status: Abnormal   Collection Time: 11/03/14  8:36 PM   Result Value Ref Range   Color, Urine YELLOW YELLOW   APPearance CLOUDY (A) CLEAR   Specific Gravity, Urine 1.012 1.005 - 1.030   pH 6.0 5.0 - 8.0   Glucose, UA NEGATIVE NEGATIVE mg/dL   Hgb urine dipstick NEGATIVE NEGATIVE   Bilirubin Urine NEGATIVE NEGATIVE   Ketones, ur NEGATIVE NEGATIVE mg/dL   Protein, ur NEGATIVE NEGATIVE mg/dL   Urobilinogen, UA 1.0 0.0 - 1.0 mg/dL   Nitrite NEGATIVE NEGATIVE   Leukocytes, UA SMALL (A) NEGATIVE  Urine microscopic-add on     Status: Abnormal   Collection Time: 11/03/14  8:36 PM  Result Value Ref Range   Squamous Epithelial / LPF MANY (A) RARE   WBC, UA 3-6 <3 WBC/hpf   RBC / HPF 0-2 <3 RBC/hpf   Bacteria, UA FEW (A) RARE   Mr Lumbar Spine Wo Contrast  11/03/2014   CLINICAL DATA:  Initial evaluation for low back pain, radiating into left leg for 2 weeks.  EXAM: MRI LUMBAR SPINE WITHOUT CONTRAST  TECHNIQUE: Multiplanar, multisequence MR imaging of the lumbar spine was performed. No intravenous contrast was administered.  COMPARISON:  None.  FINDINGS: There is transitional lumbosacral anatomy with sacralization of the L5 vertebral body. The L5-S1 intervertebral disc space is mildly rudimentary.  Mild dextroscoliosis of the lumbar spine present. No listhesis. Vertebral body heights preserved. Signal intensity within the vertebral body bone marrow is normal. No focal osseous lesion.  The conus medullaris terminates normally at the L1 level. Signal intensity within the visualized cord is normal. Nerve roots of the cauda equina are within normal limits.  Paraspinous soft tissues are within normal limits.  At T10-11 through L3-4, there is no disc bulge or disc protrusion. Intervertebral discs are well hydrated. No significant facet arthropathy. No canal or foraminal stenosis.  At L4-5: There is a large central disc protrusion indenting the thecal sac. The protruding disc measures approximately 21 x 8 mm. There is secondary moderate canal with severe bilateral  lateral recess stenosis, left worse than right. The herniated disc appears to impinge upon the transiting L5 nerve roots bilaterally as they course through the lateral recesses, worse on the left. Mild bilateral facet hypertrophy present. Foramina remain widely patent.  At L5-S1: Rudimentary disc present. No disc bulge or disc protrusion.  No canal or foraminal stenosis.  IMPRESSION: 1. Large central disc protrusion at L5-S1 with secondary severe bilateral lateral recess stenosis, left greater than right. 2. 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 Careful correlation with imaging is recommended prior to any potential future surgical intervention. 3. Mild dextroscoliosis.   Electronically Signed   By: Jeannine Boga M.D.   On: 11/03/2014 23:53    Review of Systems  Constitutional: Negative.   HENT: Negative.   Eyes: Negative.   Respiratory: Negative.   Cardiovascular: Negative.   Gastrointestinal: Negative.   Genitourinary: Negative.   Musculoskeletal: Positive for back pain.  Skin: Negative.   Neurological: Negative.   Endo/Heme/Allergies: Negative.   Psychiatric/Behavioral: Negative.     Blood pressure 111/66, pulse 110, temperature 98.5 F (36.9 C), temperature source Oral, resp. rate 14, weight 90.719 kg (200 lb), last menstrual period 10/15/2014, SpO2 98 %. 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 Plan left L4-5 laminotomy and microdiscectomy. Risks and benefits been explained. Patient wishes to proceed.  Delsie Amador A 11/05/2014, 7:48 AM

## 2014-11-05 NOTE — Discharge Summary (Signed)
Physician Discharge Summary  Patient ID: Jill MinaHeather R Cardenas MRN: [id number] DOB/AGE: 1986/09/20 28 y.o.  Admit date: 11/05/2014 Discharge date: 11/05/2014  Admission Diagnoses:  Discharge Diagnoses:  Principal Problem:   HNP (herniated nucleus pulposus), lumbar Active Problems:   Lumbar disc herniation with radiculopathy   Discharged Condition: good  Hospital Course: The patient is been in the hospital where she underwent an uncomplicated left-sided L4-5 laminotomy and microdiscectomy. Postoperatively she is doing very well. Preoperative back and leg pain are resolved. She is up ambulating without difficulty and ready for discharge home.  Consults:   Significant Diagnostic Studies:   Treatments:   Discharge Exam: Blood pressure 126/80, pulse 117, temperature 98.5 F (36.9 C), temperature source Oral, resp. rate 18, weight 90.719 kg (200 lb), last menstrual period 10/15/2014, SpO2 98 %. Awake and alert. Oriented and appropriate. Motor and sensory function intact. Wound clean and dry. Chest and abdomen benign.  Disposition: 01-Home or Self Care     Medication List    TAKE these medications        carisoprodol 350 MG tablet  Commonly known as:  SOMA  Take 350 mg by mouth every 12 (twelve) hours.     FLUoxetine 20 MG capsule  Commonly known as:  PROZAC  Take 20 mg by mouth daily.     HYDROcodone-acetaminophen 5-325 MG per tablet  Commonly known as:  NORCO/VICODIN  Take 1-2 tablets by mouth every 4 (four) hours as needed (mild pain).     methocarbamol 750 MG tablet  Commonly known as:  ROBAXIN  Take 1 tablet (750 mg total) by mouth every 6 (six) hours as needed for muscle spasms (or pain).     oxyCODONE-acetaminophen 5-325 MG per tablet  Commonly known as:  PERCOCET/ROXICET  Take 1-2 tablets by mouth every 4 (four) hours as needed for moderate pain or severe pain.           Follow-up Information    Follow up with Temple PaciniPOOL,Carter Kassel A, MD.   Specialty:  Neurosurgery   Contact information:   [address] [place] [address] [phone]       Signed: Temple PaciniOOL,Alyha Marines A 11/05/2014, 7:26 PM

## 2014-11-05 NOTE — Anesthesia Procedure Notes (Signed)
Procedure Name: Intubation Date/Time: 11/05/2014 9:00 AM Performed by: Brien MatesMAHONY, Sly Parlee D Pre-anesthesia Checklist: Patient identified, Emergency Drugs available, Suction available, Patient being monitored and Timeout performed Patient Re-evaluated:Patient Re-evaluated prior to inductionOxygen Delivery Method: Circle system utilized Preoxygenation: Pre-oxygenation with 100% oxygen Intubation Type: IV induction Ventilation: Mask ventilation without difficulty Laryngoscope Size: Miller and 2 Grade View: Grade I Tube type: Oral Tube size: 7.5 mm Number of attempts: 1 Airway Equipment and Method: Stylet Placement Confirmation: ETT inserted through vocal cords under direct vision,  positive ETCO2 and breath sounds checked- equal and bilateral Secured at: 21 cm Tube secured with: Tape Dental Injury: Teeth and Oropharynx as per pre-operative assessment

## 2014-11-05 NOTE — Progress Notes (Signed)
Discharge instructions/education and Rx given to patient with husband at bedside and they both verbalized understanding. No drainage, no redness, no signs of infection on incision site.

## 2014-11-05 NOTE — Anesthesia Preprocedure Evaluation (Signed)
Anesthesia Evaluation  Patient identified by MRN, date of birth, ID band Patient awake    Reviewed: Allergy & Precautions, H&P , NPO status , Patient's Chart, lab work & pertinent test results  Airway Mallampati: II  TM Distance: >3 FB Neck ROM: Full    Dental  (+) Teeth Intact, Dental Advisory Given   Pulmonary Current Smoker,  breath sounds clear to auscultation        Cardiovascular Rhythm:Regular Rate:Normal     Neuro/Psych    GI/Hepatic   Endo/Other    Renal/GU      Musculoskeletal   Abdominal   Peds  Hematology   Anesthesia Other Findings   Reproductive/Obstetrics                             Anesthesia Physical Anesthesia Plan  ASA: II  Anesthesia Plan: General   Post-op Pain Management:    Induction: Intravenous  Airway Management Planned: Oral ETT  Additional Equipment:   Intra-op Plan:   Post-operative Plan: Extubation in OR  Informed Consent: I have reviewed the patients History and Physical, chart, labs and discussed the procedure including the risks, benefits and alternatives for the proposed anesthesia with the patient or authorized representative who has indicated his/her understanding and acceptance.   Dental advisory given  Plan Discussed with: CRNA and Anesthesiologist  Anesthesia Plan Comments: (HNP L4-5 Smoker  Plan GA with oral ETT  Kipp Broodavid Naftali Carchi)        Anesthesia Quick Evaluation

## 2014-11-05 NOTE — Progress Notes (Signed)
Dr. Jordan LikesPool called and he will sign his orders.

## 2014-11-05 NOTE — Discharge Instructions (Signed)

## 2014-11-05 NOTE — Anesthesia Postprocedure Evaluation (Signed)
  Anesthesia Post-op Note  Patient: Jill Cardenas  Procedure(s) Performed: Procedure(s) with comments: LUMBAR LAMINECTOMY/DECOMPRESSION MICRODISCECTOMY 1 LEVEL LUMBAR 4-5 (Left) - LUMBAR LAMINECTOMY/DECOMPRESSION MICRODISCECTOMY 1 LEVEL LUMBAR 4-5  Patient Location: PACU  Anesthesia Type:General  Level of Consciousness: awake, alert  and oriented  Airway and Oxygen Therapy: Patient Spontanous Breathing and Patient connected to nasal cannula oxygen  Post-op Pain: mild  Post-op Assessment: Post-op Vital signs reviewed, Patient's Cardiovascular Status Stable, Respiratory Function Stable, Patent Airway, No signs of Nausea or vomiting and Pain level controlled  Post-op Vital Signs: stable  Last Vitals:  Filed Vitals:   11/05/14 1622  BP: 126/80  Pulse: 117  Temp: 36.9 C  Resp: 18    Complications: No apparent anesthesia complications

## 2014-11-05 NOTE — Op Note (Signed)
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  Asst. Surgeon: None  Anesthesia: General  Indication: 28 year old female with severe back and left lower extremity pain failing conservative management. Workup demonstrates evidence of very large left paracentral disc herniation L4-5. Patient presents now for microdiscectomy.  Operative note: After induction anesthesia, patient positioned prone onto Wilson frame and appropriately padded. Lumbar region prepped and draped. Incision made overlying L4-5. Dissection performed on the left side. Retractor placed. X-ray taken. Level confirmed. Laminotomy performed using high-speed drill and Kerrison rongeurs to remove the inferior aspect lamina of L4 medial aspect the L4-5 facet joint and superior rim of the L5 lamina. Ligament flavum elevated and resected piecemeal fashion. Underlying thecal sac and exiting L5 nerve root identified. Microscope front field using microdissection the spinal canal. Epidural venous plexus quite related and cut. Thecal sac and L5 nerve root gently mobilized and retracted. Disc herniation readily apparent. This is an incised with 15 blade. A very large fragment of disc was then dissected free and removed. Disc space was entered. All loose were obviously degenerative disc tear was removed and interspace. At this point a very thorough discectomy been achieved. There was no evidence of injury to the thecal sac and reverse. Wound is then irrigated with saline solution. Gelfoam placed topically for hemostasis. Microscope and retractors were removed. Hemostasis muscles she will chart. Wounds closed in layers. Steri-Strips and sterile dressing were applied. No apparent complications. Patient  tolerated the procedure well and she returns to the recovery room.

## 2014-11-07 ENCOUNTER — Encounter (HOSPITAL_COMMUNITY): Payer: Self-pay | Admitting: Neurosurgery

## 2015-11-22 DIAGNOSIS — F419 Anxiety disorder, unspecified: Secondary | ICD-10-CM

## 2015-11-22 HISTORY — DX: Anxiety disorder, unspecified: F41.9

## 2016-08-11 ENCOUNTER — Emergency Department (HOSPITAL_BASED_OUTPATIENT_CLINIC_OR_DEPARTMENT_OTHER): Payer: Medicaid Other

## 2016-08-11 ENCOUNTER — Emergency Department (HOSPITAL_BASED_OUTPATIENT_CLINIC_OR_DEPARTMENT_OTHER)
Admission: EM | Admit: 2016-08-11 | Discharge: 2016-08-11 | Disposition: A | Discharge status: Home / Self Care | Payer: Medicaid Other | Attending: Emergency Medicine | Admitting: Emergency Medicine

## 2016-08-11 ENCOUNTER — Encounter (HOSPITAL_BASED_OUTPATIENT_CLINIC_OR_DEPARTMENT_OTHER): Payer: Self-pay | Admitting: Emergency Medicine

## 2016-08-11 DIAGNOSIS — F17210 Nicotine dependence, cigarettes, uncomplicated: Secondary | ICD-10-CM | POA: Insufficient documentation

## 2016-08-11 DIAGNOSIS — M545 Low back pain, unspecified: Secondary | ICD-10-CM

## 2016-08-11 DIAGNOSIS — R319 Hematuria, unspecified: Secondary | ICD-10-CM | POA: Diagnosis not present

## 2016-08-11 LAB — URINALYSIS, ROUTINE W REFLEX MICROSCOPIC
Bilirubin Urine: NEGATIVE
Glucose, UA: NEGATIVE mg/dL
Hgb urine dipstick: NEGATIVE
Ketones, ur: 15 mg/dL — AB
Leukocytes, UA: NEGATIVE
Nitrite: NEGATIVE
PROTEIN: NEGATIVE mg/dL
Specific Gravity, Urine: 1.004 — ABNORMAL LOW (ref 1.005–1.030)
pH: 6.5 (ref 5.0–8.0)

## 2016-08-11 LAB — CBC WITH DIFFERENTIAL/PLATELET
Basophils Absolute: 0 10*3/uL (ref 0.0–0.1)
Basophils Relative: 0 %
Eosinophils Absolute: 0.2 10*3/uL (ref 0.0–0.7)
Eosinophils Relative: 1 %
HEMATOCRIT: 42.4 % (ref 36.0–46.0)
HEMOGLOBIN: 14.4 g/dL (ref 12.0–15.0)
LYMPHS ABS: 3.7 10*3/uL (ref 0.7–4.0)
LYMPHS PCT: 24 %
MCH: 30.6 pg (ref 26.0–34.0)
MCHC: 34 g/dL (ref 30.0–36.0)
MCV: 90 fL (ref 78.0–100.0)
MONOS PCT: 8 %
Monocytes Absolute: 1.3 10*3/uL — ABNORMAL HIGH (ref 0.1–1.0)
NEUTROS ABS: 10.1 10*3/uL — AB (ref 1.7–7.7)
NEUTROS PCT: 67 %
Platelets: 358 10*3/uL (ref 150–400)
RBC: 4.71 MIL/uL (ref 3.87–5.11)
RDW: 13.3 % (ref 11.5–15.5)
WBC: 15.3 10*3/uL — ABNORMAL HIGH (ref 4.0–10.5)

## 2016-08-11 LAB — COMPREHENSIVE METABOLIC PANEL
ALT: 13 U/L — AB (ref 14–54)
AST: 14 U/L — ABNORMAL LOW (ref 15–41)
Albumin: 4.6 g/dL (ref 3.5–5.0)
Alkaline Phosphatase: 45 U/L (ref 38–126)
Anion gap: 9 (ref 5–15)
BUN: 8 mg/dL (ref 6–20)
CHLORIDE: 106 mmol/L (ref 101–111)
CO2: 22 mmol/L (ref 22–32)
Calcium: 9.2 mg/dL (ref 8.9–10.3)
Creatinine, Ser: 0.66 mg/dL (ref 0.44–1.00)
GFR calc non Af Amer: >60 mL/min (ref >60)
Glucose, Bld: 84 mg/dL (ref 65–99)
POTASSIUM: 3.9 mmol/L (ref 3.5–5.1)
SODIUM: 137 mmol/L (ref 135–145)
Total Bilirubin: 0.6 mg/dL (ref 0.3–1.2)
Total Protein: 7.2 g/dL (ref 6.5–8.1)

## 2016-08-11 LAB — PREGNANCY, URINE: Preg Test, Ur: NEGATIVE

## 2016-08-11 LAB — LIPASE, BLOOD: Lipase: 29 U/L (ref 11–51)

## 2016-08-11 MED ORDER — NAPROXEN 250 MG PO TABS: 250.0000 mg | ORAL_TABLET | Freq: Two times a day (BID) | ORAL | 0 refills | Status: DC

## 2016-08-11 MED ORDER — METHOCARBAMOL 500 MG PO TABS: 500.0000 mg | ORAL_TABLET | Freq: Two times a day (BID) | ORAL | 0 refills | Status: DC | PRN

## 2016-08-11 NOTE — ED Provider Notes (Signed)
[facility] Provider Note   CSN: [id number] Arrival date & time: 08/11/16  1919   By signing my name below, I, Freida Busman, attest that this documentation has been prepared under the direction and in the presence of non-physician practitioner, Everlene Farrier, PA-C. Electronically Signed: Freida Busman, Scribe. 08/11/2016. 9:26 PM.   History   Chief Complaint Chief Complaint  Patient presents with  . Back Pain    The history is provided by the patient. No language interpreter was used.     HPI Comments:  Jill Cardenas is a 30 y.o. female who presents to the Emergency Department complaining of 6/10, constant, right flank pain and right sided back pain which began today. She reports associated hematuria which began ~ 1200 today; states she had 3 episodes which then resolved.  She also notes nausea, dysuria, and urinary frequency today. Pt states she has a h/o kidney stones. No history of Lithotripsy or stenting. She denies vomiting and diarrhea, fever SOB, vaginal bleeding and vaginal discharge.  She has a h/o C-section; no other abdominal surgeries. No alleviating factors noted.   Past Medical History:  Diagnosis Date  . Cholecystitis   . Depression   . Kidney stones     Patient Active Problem List   Diagnosis Date Noted  . HNP (herniated nucleus pulposus), lumbar 11/05/2014  . 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 ANKLE ARTHROSCOPY W/ OPEN REPAIR Right   . cesarian    . LUMBAR LAMINECTOMY/DECOMPRESSION MICRODISCECTOMY Left 11/05/2014   Procedure: LUMBAR LAMINECTOMY/DECOMPRESSION MICRODISCECTOMY 1 LEVEL LUMBAR 4-5;  Surgeon: Temple Pacini, MD;  Location: [facility] OR;  Service: Neurosurgery;  Laterality: Left;  LUMBAR LAMINECTOMY/DECOMPRESSION MICRODISCECTOMY 1 LEVEL LUMBAR 4-5  . TUBAL LIGATION      OB History    No data available       Home Medications    Prior to Admission medications    Medication Sig Start Date End Date Taking? Authorizing Provider  carisoprodol (SOMA) 350 MG tablet Take 350 mg by mouth every 12 (twelve) hours. 10/28/14   Historical Provider, MD  FLUoxetine (PROZAC) 20 MG capsule Take 20 mg by mouth daily.    Historical Provider, MD  HYDROcodone-acetaminophen (NORCO/VICODIN) 5-325 MG per tablet Take 1-2 tablets by mouth every 4 (four) hours as needed (mild pain). 11/05/14   Julio Sicks, MD  methocarbamol (ROBAXIN) 500 MG tablet Take 1 tablet (500 mg total) by mouth 2 (two) times daily as needed for muscle spasms. 08/11/16   Everlene Farrier, PA-C  naproxen (NAPROSYN) 250 MG tablet Take 1 tablet (250 mg total) by mouth 2 (two) times daily with a meal. 08/11/16   Everlene Farrier, PA-C  oxyCODONE-acetaminophen (PERCOCET/ROXICET) 5-325 MG per tablet Take 1-2 tablets by mouth every 4 (four) hours as needed for moderate pain or severe pain. 11/04/14   Trixie Dredge, PA-C    Family History Family History  Problem Relation Age of Onset  . Emphysema Mother     Social History Social History  Substance Use Topics  . Smoking status: Current Every Day Smoker    Packs/day: 1.00    Types: Cigarettes  . Smokeless tobacco: Never Used  . Alcohol use No     Allergies   Latex   Review of Systems Review of Systems  Constitutional: Negative for chills and fever.  HENT: Negative for congestion and sore throat.   Eyes: Negative for visual disturbance.  Respiratory: Negative for cough, shortness  of breath and wheezing.   Cardiovascular: Negative for chest pain and palpitations.  Gastrointestinal: Negative for abdominal pain, diarrhea, nausea and vomiting.  Genitourinary: Positive for flank pain, frequency and hematuria. Negative for decreased urine volume, difficulty urinating, dysuria, menstrual problem, pelvic pain, urgency, vaginal bleeding, vaginal discharge and vaginal pain.  Musculoskeletal: Positive for back pain. Negative for neck pain.  Skin: Negative for rash.    Neurological: Negative for weakness, numbness and headaches.     Physical Exam Updated Vital Signs BP 109/82 (BP Location: Left Arm)   Pulse 80   Temp 98.6 F (37 C) (Oral)   Resp 16   Ht 5\' 3"  (1.6 m)   Wt 68.2 kg   LMP 07/28/2016   SpO2 99%   BMI 26.62 kg/m   Physical Exam  Constitutional: She is oriented to person, place, and time. She appears well-developed and well-nourished. No distress.  Non-toxic apperaing  HENT:  Head: Normocephalic and atraumatic.  Mouth/Throat: Oropharynx is clear and moist.  Eyes: Conjunctivae are normal. Pupils are equal, round, and reactive to light. Right eye exhibits no discharge. Left eye exhibits no discharge.  Neck: Neck supple.  Cardiovascular: Normal rate, regular rhythm, normal heart sounds and intact distal pulses.  Exam reveals no gallop and no friction rub.   No murmur heard. Pulmonary/Chest: Effort normal and breath sounds normal. No respiratory distress. She has no wheezes. She has no rales.  Abdominal: Soft. Bowel sounds are normal. She exhibits no distension and no mass. There is tenderness. There is no rebound and no guarding.  Abdomen soft. Bowel sounds present. There is right mid lateral abdominal tenderness and right lateral low back TTP.  No real CVA or flank tenderness on exam, tenderness seems to be lower on palpation.  No peritoneal signs   Musculoskeletal: Normal range of motion. She exhibits tenderness. She exhibits no edema.  Patient is tenderness to her right lateral low back. No midline neck or back tenderness. Good strength to her bilateral upper extremities. Normal gait.  Lymphadenopathy:    She has no cervical adenopathy.  Neurological: She is alert and oriented to person, place, and time. Coordination normal.  Skin: Skin is warm and dry. Capillary refill takes less than 2 seconds. No rash noted. She is not diaphoretic. No erythema. No pallor.  Psychiatric: She has a normal mood and affect. Her behavior is normal.   Nursing note and vitals reviewed.    ED Treatments / Results  DIAGNOSTIC STUDIES:  Oxygen Saturation is 98% on RA, normal by my interpretation.    COORDINATION OF CARE:  9:24 PM Discussed treatment plan with pt at bedside and pt agreed to plan.  Labs (all labs ordered are listed, but only abnormal results are displayed) Labs Reviewed  URINALYSIS, ROUTINE W REFLEX MICROSCOPIC (NOT AT [facility]) - Abnormal; Notable for the following:       Result Value   Specific Gravity, Urine 1.004 (*)    Ketones, ur 15 (*)    All other components within normal limits  COMPREHENSIVE METABOLIC PANEL - Abnormal; Notable for the following:    AST 14 (*)    ALT 13 (*)    All other components within normal limits  CBC WITH DIFFERENTIAL/PLATELET - Abnormal; Notable for the following:    WBC 15.3 (*)    Neutro Abs 10.1 (*)    Monocytes Absolute 1.3 (*)    All other components within normal limits  PREGNANCY, URINE  LIPASE, BLOOD    EKG  EKG Interpretation  None       Radiology Ct Renal Stone Study  Result Date: 08/11/2016 CLINICAL DATA:  Acute onset of right-sided flank pain and hematuria. Initial encounter. EXAM: CT ABDOMEN AND PELVIS WITHOUT CONTRAST TECHNIQUE: Multidetector CT imaging of the abdomen and pelvis was performed following the standard protocol without IV contrast. COMPARISON:  CT of the abdomen and pelvis from 03/03/2014, and MRI of the lumbar spine performed 11/03/2014 FINDINGS: Lower chest: The visualized lung bases are grossly clear. The visualized portions of the mediastinum are unremarkable. Hepatobiliary: A 1.3 cm hypodensity is noted within the left hepatic lobe, mildly increased in size from 2015. This likely reflects a small cyst. The liver is otherwise unremarkable. The gallbladder is unremarkable in appearance. The common bile duct remains normal in caliber. Pancreas: The pancreas is within normal limits. Spleen: The spleen is unremarkable in appearance. Adrenals/Urinary  Tract: The adrenal glands are unremarkable in appearance. A 4 mm stone is noted at the lower pole of the left kidney. The kidneys are otherwise unremarkable. There is no evidence of hydronephrosis. No obstructing ureteral stones are identified. No perinephric stranding is seen. Stomach/Bowel: The stomach is unremarkable in appearance. The small bowel is within normal limits. The appendix is normal in caliber, without evidence of appendicitis. The colon is unremarkable in appearance. Vascular/Lymphatic: Minimal calcification is noted along the distal abdominal aorta. No retroperitoneal lymphadenopathy is seen. No pelvic sidewall lymphadenopathy seen. No inguinal lymphadenopathy is appreciated. Reproductive: The bladder is mildly distended and within normal limits. The uterus is grossly unremarkable in appearance. The ovaries are relatively symmetric. No suspicious adnexal masses are seen. Other: No additional soft tissue abnormalities are seen. Musculoskeletal: No acute osseous abnormalities are identified. The visualized musculature is unremarkable in appearance. IMPRESSION: 1. No acute abnormality seen to explain the patient's symptoms. 2. 4 mm nonobstructing stone at the lower pole of the left kidney. 3. 1.3 cm hypodensity within the liver has increased only mildly in size from 2015 and is likely benign. 4. Minimal aortic atherosclerosis, advanced for age. Electronically Signed   By: Roanna RaiderJeffery  Chang M.D.   On: 08/11/2016 22:24    Procedures Procedures (including critical care time)  Medications Ordered in ED Medications - No data to display   Initial Impression / Assessment and Plan / ED Course  I have reviewed the triage vital signs and the nursing notes.  Pertinent labs & imaging results that were available during my care of the patient were reviewed by me and considered in my medical decision making (see chart for details).  Clinical Course   Patient presented with 3 episodes of hematuria today  with associated right flank pain. She is concerned for a kidney stone. She reports that hematuria has since resolved today. She reports her pain is constant.  On exam patient is afebrile nontoxic appearing. She has tenderness to her right lateral low back. No midline neck or back tenderness. Abdomen is soft and she has some mild right lateral midabdominal tenderness to palpation. No peritoneal signs. CBC is remarkable only for white count of 15,000. Patient has had elevations her white count many times previously. CMP is unremarkable. Lipase is within normal limits. Pregnancy test is negative. Urinalysis shows no sign of infection and shows no hemoglobin. CT renal stone study showed no evidence of acute abnormality to explain the patient's symptoms. There is a nonobstructive stone on the left side. No right stone. Patient reports she is aware of the liver hypodensity. I explained test results. She is reassured. She  has required no pain medicine in the emergency department. Suspect musculoskeletal pain. Will start her on naproxen and Robaxin and have her follow-up with her primary care doctor closely. Discussed back exercises and strict return precautions. I advised the patient to follow-up with their primary care provider this week. I advised the patient to return to the emergency department with new or worsening symptoms or new concerns. The patient verbalized understanding and agreement with plan.     Final Clinical Impressions(s) / ED Diagnoses   Final diagnoses:  Right-sided low back pain without sciatica    New Prescriptions New Prescriptions   METHOCARBAMOL (ROBAXIN) 500 MG TABLET    Take 1 tablet (500 mg total) by mouth 2 (two) times daily as needed for muscle spasms.   NAPROXEN (NAPROSYN) 250 MG TABLET    Take 1 tablet (250 mg total) by mouth 2 (two) times daily with a meal.   I personally performed the services described in this documentation, which was scribed in my presence. The recorded  information has been reviewed and is accurate.      Everlene Farrier, PA-C 08/11/16 2324    Benjiman Core, MD 08/13/16 [phone]

## 2016-08-11 NOTE — ED Notes (Signed)
Pt c/o back pain onset this am w urinary freq

## 2016-08-11 NOTE — ED Triage Notes (Signed)
Patient reports that she is having right sided flank pain x a few days. Reports some blood in her urine earlier today

## 2016-08-30 ENCOUNTER — Encounter (HOSPITAL_COMMUNITY): Payer: Self-pay

## 2016-08-30 ENCOUNTER — Emergency Department (HOSPITAL_COMMUNITY)
Admission: EM | Admit: 2016-08-30 | Discharge: 2016-08-31 | Disposition: A | Discharge status: Other Acute Inpatient Hospital | Payer: BLUE CROSS/BLUE SHIELD | Attending: Emergency Medicine | Admitting: Emergency Medicine

## 2016-08-30 DIAGNOSIS — Z91040 Latex allergy status: Secondary | ICD-10-CM | POA: Diagnosis not present

## 2016-08-30 DIAGNOSIS — F17210 Nicotine dependence, cigarettes, uncomplicated: Secondary | ICD-10-CM | POA: Insufficient documentation

## 2016-08-30 DIAGNOSIS — T50902A Poisoning by unspecified drugs, medicaments and biological substances, intentional self-harm, initial encounter: Secondary | ICD-10-CM

## 2016-08-30 DIAGNOSIS — T391X2A Poisoning by 4-Aminophenol derivatives, intentional self-harm, initial encounter: Secondary | ICD-10-CM | POA: Diagnosis not present

## 2016-08-30 DIAGNOSIS — T1491XA Suicide attempt, initial encounter: Secondary | ICD-10-CM

## 2016-08-30 LAB — URINALYSIS, ROUTINE W REFLEX MICROSCOPIC
BILIRUBIN URINE: NEGATIVE
Glucose, UA: NEGATIVE mg/dL
Hgb urine dipstick: NEGATIVE
Ketones, ur: 15 mg/dL — AB
NITRITE: NEGATIVE
Protein, ur: NEGATIVE mg/dL
SPECIFIC GRAVITY, URINE: 1.009 (ref 1.005–1.030)
pH: 6.5 (ref 5.0–8.0)

## 2016-08-30 LAB — I-STAT BETA HCG BLOOD, ED (MC, WL, AP ONLY)

## 2016-08-30 LAB — COMPREHENSIVE METABOLIC PANEL
ALK PHOS: 39 U/L (ref 38–126)
ALT: 8 U/L — ABNORMAL LOW (ref 14–54)
ANION GAP: 7 (ref 5–15)
AST: 14 U/L — ABNORMAL LOW (ref 15–41)
Albumin: 3.9 g/dL (ref 3.5–5.0)
BILIRUBIN TOTAL: 0.9 mg/dL (ref 0.3–1.2)
BUN: 7 mg/dL (ref 6–20)
CALCIUM: 9.2 mg/dL (ref 8.9–10.3)
CO2: 23 mmol/L (ref 22–32)
Chloride: 107 mmol/L (ref 101–111)
Creatinine, Ser: 0.59 mg/dL (ref 0.44–1.00)
GFR calc non Af Amer: >60 mL/min (ref >60)
Glucose, Bld: 91 mg/dL (ref 65–99)
POTASSIUM: 3.8 mmol/L (ref 3.5–5.1)
Sodium: 137 mmol/L (ref 135–145)
TOTAL PROTEIN: 5.9 g/dL — AB (ref 6.5–8.1)

## 2016-08-30 LAB — CBC WITH DIFFERENTIAL/PLATELET
BASOS ABS: 0 10*3/uL (ref 0.0–0.1)
Basophils Relative: 0 %
EOS ABS: 0.3 10*3/uL (ref 0.0–0.7)
Eosinophils Relative: 2 %
HCT: 39.4 % (ref 36.0–46.0)
HEMOGLOBIN: 13.5 g/dL (ref 12.0–15.0)
LYMPHS PCT: 24 %
Lymphs Abs: 3.1 10*3/uL (ref 0.7–4.0)
MCH: 30.8 pg (ref 26.0–34.0)
MCHC: 34.3 g/dL (ref 30.0–36.0)
MCV: 90 fL (ref 78.0–100.0)
MONOS PCT: 9 %
Monocytes Absolute: 1.2 10*3/uL — ABNORMAL HIGH (ref 0.1–1.0)
NEUTROS PCT: 65 %
Neutro Abs: 8.3 10*3/uL — ABNORMAL HIGH (ref 1.7–7.7)
Platelets: 283 10*3/uL (ref 150–400)
RBC: 4.38 MIL/uL (ref 3.87–5.11)
RDW: 13.4 % (ref 11.5–15.5)
WBC: 12.9 10*3/uL — AB (ref 4.0–10.5)

## 2016-08-30 LAB — RAPID URINE DRUG SCREEN, HOSP PERFORMED
AMPHETAMINES: NOT DETECTED
BENZODIAZEPINES: NOT DETECTED
Barbiturates: NOT DETECTED
Cocaine: NOT DETECTED
OPIATES: POSITIVE — AB
TETRAHYDROCANNABINOL: NOT DETECTED

## 2016-08-30 LAB — ETHANOL

## 2016-08-30 LAB — ACETAMINOPHEN LEVEL: ACETAMINOPHEN (TYLENOL), SERUM: 15 ug/mL (ref 10–30)

## 2016-08-30 LAB — URINE MICROSCOPIC-ADD ON: RBC / HPF: NONE SEEN RBC/hpf (ref 0–5)

## 2016-08-30 LAB — SALICYLATE LEVEL: Salicylate Lvl: <7 mg/dL (ref 2.8–30.0)

## 2016-08-30 MED ORDER — ONDANSETRON HCL 4 MG/2ML IJ SOLN
4.0000 mg | Freq: Once | INTRAMUSCULAR | Status: AC
  Administered 2016-08-30: 4 mg via INTRAVENOUS
  Filled 2016-08-30: qty 2

## 2016-08-30 MED ORDER — SODIUM CHLORIDE 0.9 % IV SOLN
INTRAVENOUS | Status: DC
  Administered 2016-08-30: 21:00:00 via INTRAVENOUS

## 2016-08-30 MED ORDER — SODIUM CHLORIDE 0.9 % IV BOLUS (SEPSIS)
250.0000 mL | Freq: Once | INTRAVENOUS | Status: AC
  Administered 2016-08-30: 250 mL via INTRAVENOUS

## 2016-08-30 MED ORDER — GI COCKTAIL ~~LOC~~
30.0000 mL | Freq: Once | ORAL | Status: AC
  Administered 2016-08-30: 30 mL via ORAL
  Filled 2016-08-30: qty 30

## 2016-08-30 MED ORDER — LORAZEPAM 1 MG PO TABS: 1.0000 mg | ORAL_TABLET | Freq: Three times a day (TID) | ORAL | Status: DC | PRN

## 2016-08-30 MED ORDER — IBUPROFEN 200 MG PO TABS: 600.0000 mg | ORAL_TABLET | Freq: Three times a day (TID) | ORAL | Status: DC | PRN

## 2016-08-30 NOTE — ED Triage Notes (Signed)
Pt arrived on ED unit, very drowsy but answers appropriately. States: She "wanted to kill her self".

## 2016-08-30 NOTE — ED Notes (Signed)
Pt provided with ice water, per request

## 2016-08-30 NOTE — ED Notes (Signed)
Pt c/o reflux.  Spoke to MD, will order GI Cocktail

## 2016-08-30 NOTE — ED Provider Notes (Signed)
Repeat APAP level negative Will consult psychiatry Pt medically stable BP 138/82   Pulse 84   Temp 99.1 F (37.3 C) (Oral)   Resp 16   Ht 5\' 3"  (1.6 m)   Wt 68 kg   LMP 07/28/2016   SpO2 100%   BMI 26.57 kg/m     Zadie Rhineonald Jari Carollo, MD 08/30/16 2357

## 2016-08-30 NOTE — ED Provider Notes (Signed)
[facility] Provider Note   CSN: [id number] Arrival date & time: 08/30/16  1750     History   Chief Complaint Chief Complaint  Patient presents with  . Drug Overdose    HPI Jill Cardenas is a 30 y.o. female.  Patient brought in by EMS. Patient with ingestion of 10+ Vicodin tablets at 4:30 PM. Patient denies ingestion of any other substance. No ingestion of alcohol. No nausea no vomiting no diarrhea no abdominal pain. Patient states that this was a suicide attempt. Patient admits to a prior suicide attempt several years ago. Patient is followed by a local psychiatrist.      Past Medical History:  Diagnosis Date  . Cholecystitis   . Depression   . Kidney stones     Patient Active Problem List   Diagnosis Date Noted  . HNP (herniated nucleus pulposus), lumbar 11/05/2014  . 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 ANKLE ARTHROSCOPY W/ OPEN REPAIR Right   . cesarian    . LUMBAR LAMINECTOMY/DECOMPRESSION MICRODISCECTOMY Left 11/05/2014   Procedure: LUMBAR LAMINECTOMY/DECOMPRESSION MICRODISCECTOMY 1 LEVEL LUMBAR 4-5;  Surgeon: Temple PaciniHenry A Pool, MD;  Location: [facility] OR;  Service: Neurosurgery;  Laterality: Left;  LUMBAR LAMINECTOMY/DECOMPRESSION MICRODISCECTOMY 1 LEVEL LUMBAR 4-5  . TUBAL LIGATION      OB History    No data available       Home Medications    Prior to Admission medications   Medication Sig Start Date End Date Taking? Authorizing Provider  carisoprodol (SOMA) 350 MG tablet Take 350 mg by mouth at bedtime as needed for muscle spasms.  10/28/14  Yes Historical Provider, MD  HYDROcodone-acetaminophen (NORCO/VICODIN) 5-325 MG per tablet Take 1-2 tablets by mouth every 4 (four) hours as needed (mild pain). 11/05/14  Yes Julio SicksHenry Pool, MD  methocarbamol (ROBAXIN) 500 MG tablet Take 1 tablet (500 mg total) by mouth 2 (two) times daily as needed for muscle spasms. 08/11/16  Yes Everlene FarrierWilliam Dansie,  PA-C  naproxen (NAPROSYN) 250 MG tablet Take 1 tablet (250 mg total) by mouth 2 (two) times daily with a meal. 08/11/16  Yes Everlene FarrierWilliam Dansie, PA-C  acetaminophen (TYLENOL) 500 MG tablet Take 500-1,000 mg by mouth every 6 (six) hours as needed (for pain).    Historical Provider, MD  oxyCODONE-acetaminophen (PERCOCET/ROXICET) 5-325 MG per tablet Take 1-2 tablets by mouth every 4 (four) hours as needed for moderate pain or severe pain. Patient not taking: Reported on 08/30/2016 11/04/14   Trixie DredgeEmily West, PA-C    Family History Family History  Problem Relation Age of Onset  . Emphysema Mother     Social History Social History  Substance Use Topics  . Smoking status: Current Every Day Smoker    Packs/day: 1.00    Types: Cigarettes  . Smokeless tobacco: Never Used  . Alcohol use No     Allergies   Latex   Review of Systems Review of Systems  Constitutional: Negative for fever.  HENT: Negative for congestion.   Respiratory: Negative for shortness of breath.   Cardiovascular: Negative for chest pain.  Gastrointestinal: Negative for abdominal pain, diarrhea, nausea and vomiting.  Genitourinary: Negative for dysuria and hematuria.  Musculoskeletal: Negative for back pain.  Neurological: Negative for seizures, syncope and headaches.  Hematological: Does not bruise/bleed easily.  Psychiatric/Behavioral: Positive for suicidal ideas. Negative for confusion.     Physical Exam Updated Vital Signs BP 138/82   Pulse 84   Temp 99.1  F (37.3 C) (Oral)   Resp 16   Ht 5\' 3"  (1.6 m)   Wt 68 kg   LMP 07/28/2016   SpO2 100%   BMI 26.57 kg/m   Physical Exam  Constitutional: She is oriented to person, place, and time. She appears well-developed and well-nourished. No distress.  HENT:  Head: Normocephalic and atraumatic.  Mouth/Throat: Oropharynx is clear and moist.  Eyes: EOM are normal. Pupils are equal, round, and reactive to light.  Neck: Normal range of motion. Neck supple.    Cardiovascular: Normal rate, regular rhythm and normal heart sounds.   Pulmonary/Chest: Effort normal and breath sounds normal. No respiratory distress.  Abdominal: Soft. Bowel sounds are normal. There is no tenderness.  Musculoskeletal: Normal range of motion. She exhibits no edema.  Neurological: She is alert and oriented to person, place, and time.  Slightly drowsy  Skin: Skin is warm. Capillary refill takes less than 2 seconds.  Nursing note and vitals reviewed.    ED Treatments / Results  Labs (all labs ordered are listed, but only abnormal results are displayed) Labs Reviewed  RAPID URINE DRUG SCREEN, HOSP PERFORMED - Abnormal; Notable for the following:       Result Value   Opiates POSITIVE (*)    All other components within normal limits  COMPREHENSIVE METABOLIC PANEL - Abnormal; Notable for the following:    Total Protein 5.9 (*)    AST 14 (*)    ALT 8 (*)    All other components within normal limits  CBC WITH DIFFERENTIAL/PLATELET - Abnormal; Notable for the following:    WBC 12.9 (*)    Neutro Abs 8.3 (*)    Monocytes Absolute 1.2 (*)    All other components within normal limits  URINALYSIS, ROUTINE W REFLEX MICROSCOPIC (NOT AT [facility]) - Abnormal; Notable for the following:    Ketones, ur 15 (*)    Leukocytes, UA SMALL (*)    All other components within normal limits  URINE MICROSCOPIC-ADD ON - Abnormal; Notable for the following:    Squamous Epithelial / LPF 6-30 (*)    Bacteria, UA FEW (*)    All other components within normal limits  ETHANOL  ACETAMINOPHEN LEVEL  SALICYLATE LEVEL  ACETAMINOPHEN LEVEL  ACETAMINOPHEN LEVEL  I-STAT BETA HCG BLOOD, ED ([facility], [facility], [facility] ONLY)    EKG  EKG Interpretation  Date/Time:  Tuesday August 30 2016 19:44:58 EDT Ventricular Rate:  80 PR Interval:    QRS Duration: 79 QT Interval:  371 QTC Calculation: 428 R Axis:   49 Text Interpretation:  Sinus arrhythmia Ventricular premature complex Probable left atrial enlargement  Low voltage, precordial leads Anteroseptal infarct, age indeterminate Interpretation limited secondary to artifact Confirmed by Tylie Golonka  MD, Zaylan Kissoon [phone]) on 08/30/2016 8:13:44 PM       Radiology No results found.  Procedures Procedures (including critical care time)  Medications Ordered in ED Medications  0.9 %  sodium chloride infusion ( Intravenous New Bag/Given 08/30/16 2032)  sodium chloride 0.9 % bolus 250 mL (0 mLs Intravenous Stopped 08/30/16 2122)  ondansetron (ZOFRAN) injection 4 mg (4 mg Intravenous Given 08/30/16 2032)  gi cocktail (Maalox,Lidocaine,Donnatal) (30 mLs Oral Given 08/30/16 2256)     Initial Impression / Assessment and Plan / ED Course  I have reviewed the triage vital signs and the nursing notes.  Pertinent labs & imaging results that were available during my care of the patient were reviewed by me and considered in my medical decision making (see chart  for details).  Clinical Course   Patient brought in by EMS. Patient with intentional overdose. States that she took 10+ Vicodin tablets at 4:30 in the afternoon. Denies any alcohol ingestion or ingestion of any other substances. No nausea no vomiting. Patient was drowsy upon arrival. But is now alert.  Workup showed no significant liver function test abnormalities. Urine drug screen was positive for opiates. Patient's initial screening Tylenol was elevated at 15. Patient therefore needed a 4 hour Tylenol which would've been at 8:30 in the evening however that was never drawn. Confusion among the nurses. Follow-up Tylenol is now pending. If that does not show a toxic level. Then patient is medically cleared for behavioral health evaluation and can be moved to the C pod.   Patient's second Tylenol level ordered shortly before 11:30 PM so be like a six-hour level.    Final Clinical Impressions(s) / ED Diagnoses   Final diagnoses:  Intentional drug overdose, initial encounter [facility])  Suicide attempt     New Prescriptions New Prescriptions   No medications on file     Vanetta Mulders, MD 08/30/16 2338

## 2016-08-30 NOTE — ED Notes (Signed)
Paramedics gave Jill Cardenas a Bottle of Hydrocodone-acetaminophen 5-325 with 48 pills.  There is 12 pills missing.

## 2016-08-30 NOTE — ED Notes (Signed)
Pt requesting to eat.  Spoke to MD, and keeping pt NPO at this time.  Will follow up after all labs have resulted.

## 2016-08-30 NOTE — ED Notes (Signed)
MD at the bedside  

## 2016-08-30 NOTE — ED Notes (Signed)
Pt's father called Burna Cash(Robert Ozment  [phone])) and stated pt has a regular doctor assisting her with her depression and would like them communicated with.  Pt's provider is Dr. Wallie CharPaula Pile [phone]).  Dr. Adella NissenJackowski made aware.

## 2016-08-30 NOTE — ED Notes (Signed)
Pt's medication collected, counted, pt signed, and delivered to pharmacy

## 2016-08-30 NOTE — ED Triage Notes (Signed)
Per EMS  boyfriend called he said "she took around 10 pills so "she can go to sleep". Pt drowsy but a/o

## 2016-08-31 ENCOUNTER — Inpatient Hospital Stay (HOSPITAL_COMMUNITY)
Admission: EM | Admit: 2016-08-31 | Discharge: 2016-09-02 | DRG: 885 | Disposition: A | Discharge status: Home / Self Care | Payer: BLUE CROSS/BLUE SHIELD | Source: Intra-hospital | Attending: Psychiatry | Admitting: Psychiatry

## 2016-08-31 ENCOUNTER — Encounter (HOSPITAL_COMMUNITY): Payer: Self-pay

## 2016-08-31 DIAGNOSIS — Z87442 Personal history of urinary calculi: Secondary | ICD-10-CM

## 2016-08-31 DIAGNOSIS — R45851 Suicidal ideations: Secondary | ICD-10-CM | POA: Diagnosis present

## 2016-08-31 DIAGNOSIS — Z79899 Other long term (current) drug therapy: Secondary | ICD-10-CM

## 2016-08-31 DIAGNOSIS — F17210 Nicotine dependence, cigarettes, uncomplicated: Secondary | ICD-10-CM | POA: Diagnosis present

## 2016-08-31 DIAGNOSIS — F332 Major depressive disorder, recurrent severe without psychotic features: Secondary | ICD-10-CM | POA: Diagnosis present

## 2016-08-31 DIAGNOSIS — Y929 Unspecified place or not applicable: Secondary | ICD-10-CM | POA: Diagnosis not present

## 2016-08-31 DIAGNOSIS — Z8489 Family history of other specified conditions: Secondary | ICD-10-CM | POA: Diagnosis not present

## 2016-08-31 DIAGNOSIS — T402X2A Poisoning by other opioids, intentional self-harm, initial encounter: Secondary | ICD-10-CM | POA: Diagnosis present

## 2016-08-31 DIAGNOSIS — T391X2A Poisoning by 4-Aminophenol derivatives, intentional self-harm, initial encounter: Secondary | ICD-10-CM | POA: Diagnosis present

## 2016-08-31 DIAGNOSIS — Z9889 Other specified postprocedural states: Secondary | ICD-10-CM | POA: Diagnosis not present

## 2016-08-31 DIAGNOSIS — Z91040 Latex allergy status: Secondary | ICD-10-CM

## 2016-08-31 MED ORDER — TRAZODONE HCL 50 MG PO TABS
50.0000 mg | ORAL_TABLET | Freq: Every evening | ORAL | Status: DC | PRN
  Filled 2016-08-31 (×8): qty 1

## 2016-08-31 MED ORDER — ALUM & MAG HYDROXIDE-SIMETH 200-200-20 MG/5ML PO SUSP: 30.0000 mL | ORAL | Status: DC | PRN

## 2016-08-31 MED ORDER — METHOCARBAMOL 500 MG PO TABS: 500.0000 mg | ORAL_TABLET | Freq: Four times a day (QID) | ORAL | Status: DC | PRN

## 2016-08-31 MED ORDER — DULOXETINE HCL 20 MG PO CPEP
20.0000 mg | ORAL_CAPSULE | Freq: Two times a day (BID) | ORAL | Status: DC
  Administered 2016-08-31 – 2016-09-01 (×3): 20 mg via ORAL
  Filled 2016-08-31 (×6): qty 1

## 2016-08-31 MED ORDER — NAPROXEN 250 MG PO TABS
250.0000 mg | ORAL_TABLET | Freq: Two times a day (BID) | ORAL | Status: DC
  Filled 2016-08-31 (×10): qty 1

## 2016-08-31 MED ORDER — BOOST / RESOURCE BREEZE PO LIQD
1.0000 | Freq: Two times a day (BID) | ORAL | Status: DC
  Filled 2016-08-31 (×9): qty 1

## 2016-08-31 MED ORDER — HYDROXYZINE HCL 25 MG PO TABS: 25.0000 mg | ORAL_TABLET | Freq: Four times a day (QID) | ORAL | Status: DC | PRN

## 2016-08-31 MED ORDER — MAGNESIUM HYDROXIDE 400 MG/5ML PO SUSP: 30.0000 mL | Freq: Every day | ORAL | Status: DC | PRN

## 2016-08-31 NOTE — Progress Notes (Signed)
NUTRITION ASSESSMENT  Pt identified as at risk on the Malnutrition Screen Tool  INTERVENTION: 1. Educated patient on the importance of nutrition and encouraged intake of food and beverages. 2. Discussed weight goals. 3. Supplements: will order Boost Breeze BID, each supplement provides 250 kcal and 9 grams of protein   NUTRITION DIAGNOSIS: Unintentional weight loss related to sub-optimal intake as evidenced by pt report.   Goal: Pt to meet >/= 90% of their estimated nutrition needs.  Monitor:  PO intake  Assessment:  Pt admitted after taking 10+ Vicodin as a suicide attempt. Pt with hx of suicide attempts. Per chart review, pt has lost 12 lbs (8% body weight) in the past 1 month which is significant for time frame. Will order Boost Breeze BID and switch to Ensure if needed. Continue to encourage PO intakes of meals, supplements, and snacks.   30 y.o. female  Height: Ht Readings from Last 1 Encounters:  08/31/16 5\' 3"  (1.6 m)    Weight: Wt Readings from Last 1 Encounters:  08/31/16 138 lb (62.6 kg)    Weight Hx: Wt Readings from Last 10 Encounters:  08/31/16 138 lb (62.6 kg)  08/30/16 150 lb (68 kg)  08/11/16 150 lb 4.8 oz (68.2 kg)  11/05/14 200 lb (90.7 kg)  11/03/14 200 lb (90.7 kg)    BMI:  Body mass index is 24.45 kg/m. Pt meets criteria for normal weight based on current BMI.  Estimated Nutritional Needs: Kcal: 25-30 kcal/kg Protein: > 1 gram protein/kg Fluid: 1 ml/kcal  Diet Order: Diet regular Room service appropriate? Yes; Fluid consistency: Thin Pt is also offered choice of unit snacks mid-morning and mid-afternoon.  Pt is eating as desired.   Lab results and medications reviewed.     Trenton GammonJessica Bridey Brookover, MS, RD, LDN Inpatient Clinical Dietitian Pager # [phone] After hours/weekend pager # [phone]

## 2016-08-31 NOTE — ED Notes (Signed)
TTS occurring at this time 
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
## 2016-08-31 NOTE — ED Notes (Addendum)
[id number] Spoke with Linus OrnMike RN at behavioral health due to medications being left in pharmacy narcotic vault. Kathlene NovemberMike states that Patient will pick up medications once she is released. Informed of 30 day policy once discharged. Phone number left on record to be scanned into medical records

## 2016-08-31 NOTE — H&P (Signed)
Psychiatric Admission Assessment Adult  Patient Identification: Jill Cardenas MRN:  [id number] Date of Evaluation:  08/31/2016 Chief Complaint:  mdd severe with psych features Principal Diagnosis: <principal problem not specified> Diagnosis:   Patient Active Problem List   Diagnosis Date Noted  . MDD (major depressive disorder), recurrent episode, severe ([place]) [F33.2] 08/31/2016  . HNP (herniated nucleus pulposus), lumbar [M51.26] 11/05/2014  . 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 Has a long history of treatment for anxiety with prozac which she has stopped 2 yearrs ago because of the way it made her feel -restrained her mood. Associated Signs/Symptoms: Depression Symptoms:  depressed mood, feelings of worthlessness/guilt, difficulty concentrating, hopelessness, recurrent thoughts of death, suicidal attempt, anxiety, weight loss, (Hypo) Manic Symptoms:  None Anxiety Symptoms:  Excessive Worry, Panic Symptoms, Psychotic Symptoms:  None PTSD Symptoms: None Total Time spent with patient: 1 hour  Past Psychiatric History: History of treatment for anxiety with prozac for 5 months 2 years ago. No prior psychiatric hospitalization  Is the patient at risk to self? Yes.    Has the patient been a risk to self in the past 6 months? Yes.    Has the patient been a risk to self within the distant past? Yes.    Is the patient a risk to others? No.  Has the patient been a risk to others in the past 6 months? No.  Has the patient been a risk to others within the distant past? No.   Prior Inpatient Therapy:   Prior Outpatient Therapy:    Alcohol Screening: 1. How often do you have a drink containing alcohol?: Never 9. Have you or someone else been injured as a result of your  drinking?: No 10. Has a relative or friend or a doctor or another health worker been concerned about your drinking or suggested you cut down?: No Alcohol Use Disorder Identification Test Final Score (AUDIT): 0 Brief Intervention: AUDIT score less than 7 or less-screening does not suggest unhealthy drinking-brief intervention not indicated Substance Abuse History in the last 12 months:  No. Consequences of Substance Abuse: NA Previous Psychotropic Medications: Yes  Psychological Evaluations: No  Past Medical History:  Past Medical History:  Diagnosis Date  . Cholecystitis   . Depression   . Kidney stones     Past Surgical History:  Procedure Laterality Date  . ANKLE ARTHROSCOPY W/ OPEN REPAIR Right   . cesarian    . LUMBAR LAMINECTOMY/DECOMPRESSION MICRODISCECTOMY Left 11/05/2014   Procedure: LUMBAR LAMINECTOMY/DECOMPRESSION MICRODISCECTOMY 1 LEVEL LUMBAR 4-5;  Surgeon: Charlie Pitter, MD;  Location: [facility];  Service: Neurosurgery;  Laterality: Left;  LUMBAR LAMINECTOMY/DECOMPRESSION MICRODISCECTOMY 1 LEVEL LUMBAR 4-5  . TUBAL LIGATION     Family History:  Family History  Problem Relation Age of Onset  . Emphysema Mother    Family Psychiatric  History: Adopted but knows there is a family history of depression in biological family Tobacco Screening: Have you used any form of tobacco in the last 30 days? (Cigarettes, Smokeless Tobacco, Cigars, and/or Pipes): Yes Tobacco use, Select all that apply: 5 or more cigarettes per day Are you interested in Tobacco Cessation Medications?: No, patient refused Counseled patient on smoking cessation including recognizing danger situations, developing coping skills and basic information about quitting provided: Refused/Declined practical counseling Social History:  History  Alcohol Use No     History  Drug Use No    Additional Social History:                           Allergies:   Allergies  Allergen Reactions  . Latex Hives    Lab Results:  Results for orders placed or performed during the hospital encounter of 08/30/16 (from the past 48 hour(s))  Ethanol     Status: None   Collection Time: 08/30/16  6:38 PM  Result Value Ref Range   Alcohol, Ethyl (B) <5 <5 mg/dL    Comment:        LOWEST DETECTABLE LIMIT FOR SERUM ALCOHOL IS 5 mg/dL FOR MEDICAL PURPOSES ONLY   Acetaminophen level     Status: None   Collection Time: 08/30/16  6:38 PM  Result Value Ref Range   Acetaminophen (Tylenol), Serum 15 10 - 30 ug/mL    Comment:        THERAPEUTIC CONCENTRATIONS VARY SIGNIFICANTLY. A RANGE OF 10-30 ug/mL MAY BE AN EFFECTIVE CONCENTRATION FOR MANY PATIENTS. HOWEVER, SOME ARE BEST TREATED AT CONCENTRATIONS OUTSIDE THIS RANGE. ACETAMINOPHEN CONCENTRATIONS >150 ug/mL AT 4 HOURS AFTER INGESTION AND >50 ug/mL AT 12 HOURS AFTER INGESTION ARE OFTEN ASSOCIATED WITH TOXIC REACTIONS.   Salicylate level     Status: None   Collection Time: 08/30/16  6:38 PM  Result Value Ref Range   Salicylate Lvl <3.8 2.8 - 30.0 mg/dL  I-Stat beta hCG blood, ED     Status: None   Collection Time: 08/30/16  6:42 PM  Result Value Ref Range   I-stat hCG, quantitative <5.0 <5 mIU/mL   Comment 3            Comment:   GEST. AGE      CONC.  (mIU/mL)   <=1 WEEK        5 - 50     2 WEEKS       50 - 500     3 WEEKS       100 - 10,000     4 WEEKS     1,000 - 30,000        FEMALE AND NON-PREGNANT FEMALE:     LESS THAN 5 mIU/mL   Comprehensive metabolic panel     Status: Abnormal   Collection Time: 08/30/16  6:47 PM  Result Value Ref Range   Sodium 137 135 - 145 mmol/L   Potassium 3.8 3.5 - 5.1 mmol/L   Chloride 107 101 - 111 mmol/L   CO2 23 22 - 32 mmol/L   Glucose, Bld 91 65 - 99 mg/dL   BUN 7 6 - 20 mg/dL   Creatinine, Ser 0.59 0.44 - 1.00 mg/dL   Calcium 9.2 8.9 - 10.3 mg/dL   Total Protein 5.9 (L) 6.5 - 8.1 g/dL   Albumin 3.9 3.5 - 5.0 g/dL   AST 14 (L) 15 - 41 U/L   ALT 8 (L) 14 - 54 U/L   Alkaline Phosphatase 39 38 -  126 U/L   Total Bilirubin 0.9 0.3 - 1.2 mg/dL   GFR calc non Af Amer >60 >60 mL/min   GFR calc Af Amer >60 >60 mL/min    Comment: (NOTE) The eGFR has been calculated using the CKD EPI equation. This calculation has not been validated in all clinical situations. eGFR's persistently <60 mL/min signify possible Chronic Kidney Disease.    Anion gap 7 5 -  15  CBC with Differential/Platelet     Status: Abnormal   Collection Time: 08/30/16  6:47 PM  Result Value Ref Range   WBC 12.9 (H) 4.0 - 10.5 K/uL    Comment: WHITE COUNT CONFIRMED ON SMEAR   RBC 4.38 3.87 - 5.11 MIL/uL   Hemoglobin 13.5 12.0 - 15.0 g/dL   HCT 47.3 94.2 - 61.0 %   MCV 90.0 78.0 - 100.0 fL   MCH 30.8 26.0 - 34.0 pg   MCHC 34.3 30.0 - 36.0 g/dL   RDW 31.0 65.8 - 39.5 %   Platelets 283 150 - 400 K/uL   Neutrophils Relative % 65 %   Lymphocytes Relative 24 %   Monocytes Relative 9 %   Eosinophils Relative 2 %   Basophils Relative 0 %   Neutro Abs 8.3 (H) 1.7 - 7.7 K/uL   Lymphs Abs 3.1 0.7 - 4.0 K/uL   Monocytes Absolute 1.2 (H) 0.1 - 1.0 K/uL   Eosinophils Absolute 0.3 0.0 - 0.7 K/uL   Basophils Absolute 0.0 0.0 - 0.1 K/uL   Smear Review MORPHOLOGY UNREMARKABLE   Rapid urine drug screen (hospital performed)     Status: Abnormal   Collection Time: 08/30/16  7:46 PM  Result Value Ref Range   Opiates POSITIVE (A) NONE DETECTED   Cocaine NONE DETECTED NONE DETECTED   Benzodiazepines NONE DETECTED NONE DETECTED   Amphetamines NONE DETECTED NONE DETECTED   Tetrahydrocannabinol NONE DETECTED NONE DETECTED   Barbiturates NONE DETECTED NONE DETECTED    Comment:        DRUG SCREEN FOR MEDICAL PURPOSES ONLY.  IF CONFIRMATION IS NEEDED FOR ANY PURPOSE, NOTIFY LAB WITHIN 5 DAYS.        LOWEST DETECTABLE LIMITS FOR URINE DRUG SCREEN Drug Class       Cutoff (ng/mL) Amphetamine      1000 Barbiturate      200 Benzodiazepine   200 Tricyclics       300 Opiates          300 Cocaine          300 THC              50    Urinalysis, Routine w reflex microscopic (not at [facility])     Status: Abnormal   Collection Time: 08/30/16  7:47 PM  Result Value Ref Range   Color, Urine YELLOW YELLOW   APPearance CLEAR CLEAR   Specific Gravity, Urine 1.009 1.005 - 1.030   pH 6.5 5.0 - 8.0   Glucose, UA NEGATIVE NEGATIVE mg/dL   Hgb urine dipstick NEGATIVE NEGATIVE   Bilirubin Urine NEGATIVE NEGATIVE   Ketones, ur 15 (A) NEGATIVE mg/dL   Protein, ur NEGATIVE NEGATIVE mg/dL   Nitrite NEGATIVE NEGATIVE   Leukocytes, UA SMALL (A) NEGATIVE  Urine microscopic-add on     Status: Abnormal   Collection Time: 08/30/16  7:47 PM  Result Value Ref Range   Squamous Epithelial / LPF 6-30 (A) NONE SEEN   WBC, UA 0-5 0 - 5 WBC/hpf   RBC / HPF NONE SEEN 0 - 5 RBC/hpf   Bacteria, UA FEW (A) NONE SEEN  Acetaminophen level     Status: Abnormal   Collection Time: 08/30/16 11:20 PM  Result Value Ref Range   Acetaminophen (Tylenol), Serum <10 (L) 10 - 30 ug/mL    Comment:        THERAPEUTIC CONCENTRATIONS VARY SIGNIFICANTLY. A RANGE OF 10-30 ug/mL MAY BE AN EFFECTIVE CONCENTRATION FOR MANY  PATIENTS. HOWEVER, SOME ARE BEST TREATED AT CONCENTRATIONS OUTSIDE THIS RANGE. ACETAMINOPHEN CONCENTRATIONS >150 ug/mL AT 4 HOURS AFTER INGESTION AND >50 ug/mL AT 12 HOURS AFTER INGESTION ARE OFTEN ASSOCIATED WITH TOXIC REACTIONS.     Blood Alcohol level:  Lab Results  Component Value Date   ETH <5 50/93/2671    Metabolic Disorder Labs:  No results found for: HGBA1C, MPG No results found for: PROLACTIN No results found for: CHOL, TRIG, HDL, CHOLHDL, VLDL, LDLCALC  Current Medications: Current Facility-Administered Medications  Medication Dose Route Frequency Provider Last Rate Last Dose  . alum & mag hydroxide-simeth (MAALOX/MYLANTA) 200-200-20 MG/5ML suspension 30 mL  30 mL Oral Q4H PRN Laverle Hobby, PA-C      . DULoxetine (CYMBALTA) DR capsule 20 mg  20 mg Oral BID Laverle Hobby, PA-C      . hydrOXYzine (ATARAX/VISTARIL)  tablet 25 mg  25 mg Oral Q6H PRN Laverle Hobby, PA-C      . magnesium hydroxide (MILK OF MAGNESIA) suspension 30 mL  30 mL Oral Daily PRN Laverle Hobby, PA-C      . methocarbamol (ROBAXIN) tablet 500 mg  500 mg Oral Q6H PRN Laverle Hobby, PA-C      . naproxen (NAPROSYN) tablet 250 mg  250 mg Oral BID WC Spencer E Simon, PA-C      . traZODone (DESYREL) tablet 50 mg  50 mg Oral QHS,MR X 1 Spencer E Simon, PA-C       PTA Medications: Prescriptions Prior to Admission  Medication Sig Dispense Refill Last Dose  . acetaminophen (TYLENOL) 500 MG tablet Take 500-1,000 mg by mouth every 6 (six) hours as needed (for pain).   Completed Course at Unknown time  . carisoprodol (SOMA) 350 MG tablet Take 350 mg by mouth at bedtime as needed for muscle spasms.   0 Past Week at Unknown time  . HYDROcodone-acetaminophen (NORCO/VICODIN) 5-325 MG per tablet Take 1-2 tablets by mouth every 4 (four) hours as needed (mild pain). 60 tablet 0 08/30/2016 at 1630  . methocarbamol (ROBAXIN) 500 MG tablet Take 1 tablet (500 mg total) by mouth 2 (two) times daily as needed for muscle spasms. 20 tablet 0 Past Month at Unknown time  . naproxen (NAPROSYN) 250 MG tablet Take 1 tablet (250 mg total) by mouth 2 (two) times daily with a meal. 30 tablet 0 08/30/2016 at am  . oxyCODONE-acetaminophen (PERCOCET/ROXICET) 5-325 MG per tablet Take 1-2 tablets by mouth every 4 (four) hours as needed for moderate pain or severe pain. (Patient not taking: Reported on 08/30/2016) 25 tablet 0 Not Taking at Unknown time    Musculoskeletal: Strength & Muscle Tone: within normal limits Gait & Station: normal Patient leans: N/A  Psychiatric Specialty Exam: Physical Exam-- see ED Assessment  ROS No Complaints  Blood pressure 110/73, pulse 64, temperature 98.2 F (36.8 C), temperature source Oral, resp. rate 18, height '5\' 3"'$  (1.6 m), weight 62.6 kg (138 lb), last menstrual period 07/28/2016.Body mass index is 24.45 kg/m.  General  Appearance: Casual  Eye Contact:  Fair  Speech:  Normal Rate  Volume:  Normal  Mood:  Anxious, Depressed and Irritable  Affect:  Congruent  Thought Process:  Coherent and Goal Directed  Orientation:  Full (Time, Place, and Person)  Thought Content:  Logical  Suicidal Thoughts:  Yes.  with intent/plan  Homicidal Thoughts:  No  Memory:  Immediate;   Fair Recent;   Fair Remote;   Fair  Judgement:  Fair  Insight:  Fair  Psychomotor Activity:  Normal  Concentration:  Concentration: Fair and Attention Span: Fair  Recall:  [employer] of Knowledge:  Fair  Language:  Fair  Akathisia:  No  Handed:  Right  AIMS (if indicated):     Assets:  Communication Skills Desire for Improvement Housing Intimacy Physical Health Transportation  ADL's:  Intact  Cognition:  WNL  Sleep:  Number of Hours: 1.75    Treatment Plan Summary: Daily contact with patient to assess and evaluate symptoms and progress in treatment and Medication management  Observation Level/Precautions:  15 minute checks  Laboratory:  None  Psychotherapy:    Medications:  Provided medication education regarding cymbalta and patient is willing to give it a try  Consultations:    Discharge Concerns:    Estimated LOS:  Other:     Physician Treatment Plan for Primary Diagnosis: <principal problem not specified> Long Term Goal(s): Improvement in symptoms so as ready for discharge  Short Term Goals: Ability to identify changes in lifestyle to reduce recurrence of condition will improve, Ability to verbalize feelings will improve, Ability to disclose and discuss suicidal ideas, Ability to demonstrate self-control will improve, Ability to identify and develop effective coping behaviors will improve, Ability to maintain clinical measurements within normal limits will improve, Compliance with prescribed medications will improve and Ability to identify triggers associated with substance abuse/mental health issues will  improve  Physician Treatment Plan for Secondary Diagnosis: Active Problems:   MDD (major depressive disorder), recurrent episode, severe ([place])  Long Term Goal(s): Improvement in symptoms so as ready for discharge  Short Term Goals: Ability to identify changes in lifestyle to reduce recurrence of condition will improve, Ability to verbalize feelings will improve, Ability to disclose and discuss suicidal ideas, Ability to demonstrate self-control will improve, Ability to identify and develop effective coping behaviors will improve, Ability to maintain clinical measurements within normal limits will improve, Compliance with prescribed medications will improve and Ability to identify triggers associated with substance abuse/mental health issues will improve  I certify that inpatient services furnished can reasonably be expected to improve the patient's condition.    Ruffin Frederick, MD 10/11/20178:26 AM

## 2016-08-31 NOTE — [facility] Group Notes (Signed)
[facility] LCSW Group Therapy 08/31/2016 1:15 PM  Type of Therapy: Group Therapy- Emotion Regulation  Pt did not attend, declined invitation.    Chad CordialLauren Carter, LCSWA 08/31/2016 3:38 PM

## 2016-08-31 NOTE — Progress Notes (Addendum)
Patient ID: Jill MinaHeather R Cardenas, female   DOB: 11/04/1986, 30 y.o.   MRN: [id number]  Admission Note:  D:30 yr female who presents [facility] in no acute distress for the treatment of SI and Depression. Pt appears flat and depressed. Pt was calm and cooperative, but irritable and argumentative when trying to explain 72 hr request for D/C and with admission process. Pt presents with passive SI and contracts for safety upon admission. Pt denies AVH . Pt stated she was SI x 2 weeks, pt would not reveal her stressors, per note pt had issue with work. Pt forwards little information to writer and appears to be minimizing her incident.   A: Skin was assessed and found to be clear of any abnormal marks apart from a scar on R-ankle, Low back, Tattoos- mid back, L forearm, R-wrist. PT searched and no contraband found, POC and unit policies explained and understanding verbalized. Consents obtained. Food and fluids offered, and refused.  R: Pt had no additional questions or concerns.

## 2016-08-31 NOTE — Tx Team (Signed)
Interdisciplinary Treatment and Diagnostic Plan Update  08/31/2016 Time of Session: 3:38 PM  Jill Cardenas MRN: [id number]  Principal Diagnosis: mdd severe with psych features  Secondary Diagnoses: Active Problems:   MDD (major depressive disorder), recurrent episode, severe ([facility])   Current Medications:  Current Facility-Administered Medications  Medication Dose Route Frequency Provider Last Rate Last Dose  . alum & mag hydroxide-simeth (MAALOX/MYLANTA) 200-200-20 MG/5ML suspension 30 mL  30 mL Oral Q4H PRN Laverle Hobby, PA-C      . DULoxetine (CYMBALTA) DR capsule 20 mg  20 mg Oral BID Laverle Hobby, PA-C   20 mg at 08/31/16 1158  . feeding supplement (BOOST / RESOURCE BREEZE) liquid 1 Container  1 Container Oral BID BM Myer Peer Cobos, MD      . hydrOXYzine (ATARAX/VISTARIL) tablet 25 mg  25 mg Oral Q6H PRN Laverle Hobby, PA-C      . magnesium hydroxide (MILK OF MAGNESIA) suspension 30 mL  30 mL Oral Daily PRN Laverle Hobby, PA-C      . methocarbamol (ROBAXIN) tablet 500 mg  500 mg Oral Q6H PRN Laverle Hobby, PA-C      . naproxen (NAPROSYN) tablet 250 mg  250 mg Oral BID WC Spencer E Simon, PA-C      . traZODone (DESYREL) tablet 50 mg  50 mg Oral QHS,MR X 1 Spencer E Simon, PA-C        PTA Medications: Prescriptions Prior to Admission  Medication Sig Dispense Refill Last Dose  . acetaminophen (TYLENOL) 500 MG tablet Take 500-1,000 mg by mouth every 6 (six) hours as needed (for pain).   Completed Course at Unknown time  . carisoprodol (SOMA) 350 MG tablet Take 350 mg by mouth at bedtime as needed for muscle spasms.   0 Past Week at Unknown time  . HYDROcodone-acetaminophen (NORCO/VICODIN) 5-325 MG per tablet Take 1-2 tablets by mouth every 4 (four) hours as needed (mild pain). 60 tablet 0 08/30/2016 at 1630  . methocarbamol (ROBAXIN) 500 MG tablet Take 1 tablet (500 mg total) by mouth 2 (two) times daily as needed for muscle spasms. 20 tablet 0 Past Month at Unknown time   . naproxen (NAPROSYN) 250 MG tablet Take 1 tablet (250 mg total) by mouth 2 (two) times daily with a meal. 30 tablet 0 08/30/2016 at am  . oxyCODONE-acetaminophen (PERCOCET/ROXICET) 5-325 MG per tablet Take 1-2 tablets by mouth every 4 (four) hours as needed for moderate pain or severe pain. (Patient not taking: Reported on 08/30/2016) 25 tablet 0 Not Taking at Unknown time    Treatment Modalities: Medication Management, Group therapy, Case management,  1 to 1 session with clinician, Psychoeducation, Recreational therapy.  Patient Stressors: Occupational concerns Other: Pt would not tell  Patient Strengths: Facilities manager fund of knowledge Supportive family/friends  Physician Treatment Plan for Primary Diagnosis: mdd severe with psych features Long Term Goal(s): Improvement in symptoms so as ready for discharge  Short Term Goals: Ability to identify changes in lifestyle to reduce recurrence of condition will improve Ability to verbalize feelings will improve Ability to disclose and discuss suicidal ideas Ability to demonstrate self-control will improve Ability to identify and develop effective coping behaviors will improve Ability to maintain clinical measurements within normal limits will improve Compliance with prescribed medications will improve Ability to identify triggers associated with substance abuse/mental health issues will improve  Medication Management: Evaluate patient's response, side effects, and tolerance of medication regimen.  Therapeutic Interventions: 1 to 1 sessions,  Unit Group sessions and Medication administration.  Evaluation of Outcomes: Not Met  Physician Treatment Plan for Secondary Diagnosis: Active Problems:   MDD (major depressive disorder), recurrent episode, severe ([facility])   Long Term Goal(s): Improvement in symptoms so as ready for discharge  Short Term Goals: Ability to identify changes in lifestyle to reduce recurrence of condition  will improve Ability to verbalize feelings will improve Ability to disclose and discuss suicidal ideas Ability to demonstrate self-control will improve Ability to identify and develop effective coping behaviors will improve Ability to maintain clinical measurements within normal limits will improve Compliance with prescribed medications will improve Ability to identify triggers associated with substance abuse/mental health issues will improve  Medication Management: Evaluate patient's response, side effects, and tolerance of medication regimen.  Therapeutic Interventions: 1 to 1 sessions, Unit Group sessions and Medication administration.  Evaluation of Outcomes: Not Met   RN Treatment Plan for Primary Diagnosis: mdd severe with psych features Long Term Goal(s): Knowledge of disease and therapeutic regimen to maintain health will improve  Short Term Goals: Ability to verbalize feelings will improve, Ability to disclose and discuss suicidal ideas and Ability to identify and develop effective coping behaviors will improve  Medication Management: RN will administer medications as ordered by provider, will assess and evaluate patient's response and provide education to patient for prescribed medication. RN will report any adverse and/or side effects to prescribing provider.  Therapeutic Interventions: 1 on 1 counseling sessions, Psychoeducation, Medication administration, Evaluate responses to treatment, Monitor vital signs and CBGs as ordered, Perform/monitor CIWA, COWS, AIMS and Fall Risk screenings as ordered, Perform wound care treatments as ordered.  Evaluation of Outcomes: Not Met   LCSW Treatment Plan for Primary Diagnosis: mdd severe with psych features Long Term Goal(s): Safe transition to appropriate next level of care at discharge, Engage patient in therapeutic group addressing interpersonal concerns.  Short Term Goals: Engage patient in aftercare planning with referrals and  resources, Identify triggers associated with mental health/substance abuse issues and Increase skills for wellness and recovery  Therapeutic Interventions: Assess for all discharge needs, 1 to 1 time with Social worker, Explore available resources and support systems, Assess for adequacy in community support network, Educate family and significant other(s) on suicide prevention, Complete Psychosocial Assessment, Interpersonal group therapy.  Evaluation of Outcomes: Not Met   Progress in Treatment: Attending groups: Pt is new to milieu, continuing to assess  Participating in groups: Pt is new to milieu, continuing to assess  Taking medication as prescribed: Yes, MD continues to assess for medication changes as needed Toleration medication: Yes, no side effects reported at this time Family/Significant other contact made: No, CSW attempting to make contact with boyfriend Patient understands diagnosis: Continuing to assess Discussing patient identified problems/goals with staff: Yes Medical problems stabilized or resolved: Yes Denies suicidal/homicidal ideation: Yes Issues/concerns per patient self-inventory: None Other: N/A  New problem(s) identified: None identified at this time.   New Short Term/Long Term Goal(s): None identified at this time.   Discharge Plan or Barriers: Pt will return home and follow-up with outpatient services.   Reason for Continuation of Hospitalization: Anxiety Depression Medication stabilization Suicidal ideation  Estimated Length of Stay: 3-5 days  Attendees: Patient: 08/31/2016  3:38 PM  Physician: Dr. Seth Bake 08/31/2016  3:38 PM  Nursing: Blima Ledger 08/31/2016  3:38 PM  RN Care Manager: Onnie Boer, RN 08/31/2016  3:38 PM  Social Worker: Chad Cordial, LCSW 08/31/2016  3:38 PM  Recreational Therapist:  08/31/2016  3:38  PM  Other: May Augustin, NP 08/31/2016  3:38 PM  Other:  08/31/2016  3:38 PM  Other: 08/31/2016  3:38 PM     Scribe for Treatment Team: Bo Mcclintock, LCSW 08/31/2016 3:38 PM
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
## 2016-08-31 NOTE — Progress Notes (Signed)
Recreation Therapy Notes  Date: 08/31/16 Time: 0930 Location: 300 [address] Dayroom  Group Topic: Stress Management  Goal Area(s) Addresses:  Patient will verbalize importance of using healthy stress management.  Patient will identify positive emotions associated with healthy stress management.   Intervention: Stress Management  Activity :  Progressive Muscle Relaxation.  LRT introduced the technique of progressive muscle relaxation.  LRT explained that the technique allows patients to relax muscle groups one at Cardenas time.  LRT read Cardenas script to guide the patients through the technique.  Pt were to follow along as LRT read script to engage in technique.  Education:  Stress Management, Discharge Planning.   Education Outcome: Acknowledges edcuation/In group clarification offered/Needs additional education  Clinical Observations/Feedback: Pt did not attend group.    Jill Cardenas, LRT/CTRS         Jill Cardenas 08/31/2016 1:03 PM 

## 2016-08-31 NOTE — Progress Notes (Signed)
D: Pt rude, irritable, easily agitated and hostile on approach. Pt refused to take morning meds. During assessment pt would not answer questions and walked off with an attitude. Pt guarded, cautious and forwards little information.   Pt then came to nurses station requesting to signed a 72 hour request for discharge form this morning. Form signed at [phone] am. MD made aware aware of pt med refusal and request to discharge during treatment team. A: Medications reviewed with pt. Medications offered to pt as ordered per MD. Verbal support provided. Pt encouraged to attend groups. Pt encouraged to comply with med regimen.  15 minute checks performed for safety.  R: Pt resistant to tx.

## 2016-08-31 NOTE — Clinical Social Work Note (Signed)
[facility] [phone]) - states she is case manager for patient's BCBS policy and is calling to offer discharge planning help.  As CSW was unfamiliar w this resource, RN CM has been asked to verify that this individual is representative of patients insurance company.  Santa GeneraAnne Marko Skalski, LCSW Lead Clinical Social Worker Phone:  [phone]

## 2016-08-31 NOTE — ED Notes (Signed)
Spoke with [facility] that states pt was accepted to bed 406-2, report to be called to [phone] as soon as possible

## 2016-08-31 NOTE — Progress Notes (Signed)
Patient ID: Jill MinaHeather R Cardenas, female   DOB: August 23, 1986, 30 y.o.   MRN: [id number] D: Client in room has a visitor tonight. Client reports of her day "rough" "I just want to go home" Client unwilling to share any details about admission, just acknowledges being away from stressors (work and home) does not help. "I just want to go home" A: Writer provided emotional support, attempted to get client to open up and share. Medications reviewed. Staff will monitor q3615min for safety. R: Client is safe on the unit, refuses medications.

## 2016-08-31 NOTE — ED Provider Notes (Signed)
The patient appears reasonably stabilized for transfer considering the current resources, flow, and capabilities available in the ED at this time, and I doubt any other [facility] further screening and/or treatment in the ED prior to transfer.  Accepted by dr Jama Flavorscobos Pt stable, no complaints at this time    Jill Rhineonald Pearla Mckinny, MD 08/31/16 [phone]

## 2016-08-31 NOTE — [facility] Suicide Risk Assessment (Signed)
[facility]   Nursing information obtained from:    Demographic factors:   White Current Mental Status:   Angry depressed and irritable Loss Factors:   May lose job Historical Factors:   Family history of depression Risk Reduction Factors:   Care taker for her 4 children  Total Time spent with patient: 1 hour Principal Problem: <principal problem not specified> Diagnosis:   Patient Active Problem List   Diagnosis Date Noted  . MDD (major depressive disorder), recurrent episode, severe (HCC) [F33.2] 08/31/2016  . HNP (herniated nucleus pulposus), lumbar [M51.26] 11/05/2014  . 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  World Science writerHealth Organization [facility](WHO). Score between 0-7:  no or low risk or alcohol related problems. Score between 8-15:  moderate risk of alcohol related problems. Score between 16-19:  high risk of alcohol related problems. Score 20 or above:  warrants further diagnostic evaluation for alcohol dependence and treatment.   CLINICAL FACTORS:   Depression:   Anhedonia Hopelessness Impulsivity   Musculoskeletal:see admission assessment   Psychiatric Specialty Exam:see admission assessment Physical Exam  Constitutional: She appears well-developed and well-nourished.  HENT:  Head: Normocephalic.    Review of Systems  Psychiatric/Behavioral: Positive for depression and suicidal ideas. The patient is nervous/anxious.     Blood pressure 110/73, pulse 64, temperature 98.2 F (36.8 C), temperature source Oral, resp. rate 18, height 5\' 3"  (1.6 m), weight 62.6 kg (138 lb), last menstrual period 07/28/2016.Body mass index is 24.45 kg/m.      COGNITIVE FEATURES THAT CONTRIBUTE TO RISK:   None    SUICIDE RISK:   Moderate:  Frequent suicidal ideation with limited intensity, and duration, some specificity in terms of plans, no associated intent, good self-control, limited dysphoria/symptomatology, some risk factors present, and identifiable protective factors, including available and accessible social support.   PLAN OF CARE: see admission assessment  I certify that inpatient services furnished can reasonably be expected to improve the patient's condition.  Wynelle BourgeoisUreh N Lekauwa, MD 08/31/2016, 8:49 AM

## 2016-08-31 NOTE — ED Notes (Signed)
Pt changed in to purple scrubs.  Husband brought food for pt.    All of patient's belongings will go home with pt's husband.

## 2016-08-31 NOTE — Tx Team (Signed)
Initial Treatment Plan 08/31/2016 4:40 AM Raina MinaHeather R Abernethy ZOX:[id number]    PATIENT STRESSORS: Occupational concerns Other: Pt would not tell   PATIENT STRENGTHS: Financial means General fund of knowledge Supportive family/friends   PATIENT IDENTIFIED PROBLEMS: Risk for suicide  OD  Work stressors   "leaving"  depression             DISCHARGE CRITERIA:  Improved stabilization in mood, thinking, and/or behavior Verbal commitment to aftercare and medication compliance  PRELIMINARY DISCHARGE PLAN: Attend aftercare/continuing care group Attend PHP/IOP  PATIENT/FAMILY INVOLVEMENT: This treatment plan has been presented to and reviewed with the patient, Raina MinaHeather R Hosie.  The patient and family have been given the opportunity to ask questions and make suggestions.  Delos HaringPhillips, Ryelee Albee A, RN 08/31/2016, 4:40 AM

## 2016-09-01 LAB — LIPID PANEL
CHOL/HDL RATIO: 5.6 ratio
Cholesterol: 203 mg/dL — ABNORMAL HIGH (ref 0–200)
HDL: 36 mg/dL — AB (ref >40)
LDL CALC: 141 mg/dL — AB (ref 0–99)
Triglycerides: 132 mg/dL (ref <150)
VLDL: 26 mg/dL (ref 0–40)

## 2016-09-01 LAB — ACETAMINOPHEN LEVEL: Acetaminophen (Tylenol), Serum: <10 ug/mL — ABNORMAL LOW (ref 10–30)

## 2016-09-01 LAB — TSH: TSH: 0.774 u[IU]/mL (ref 0.350–4.500)

## 2016-09-01 MED ORDER — DULOXETINE HCL 20 MG PO CPEP
40.0000 mg | ORAL_CAPSULE | Freq: Every day | ORAL | Status: DC
  Administered 2016-09-02: 40 mg via ORAL
  Filled 2016-09-01 (×3): qty 2

## 2016-09-01 MED ORDER — DULOXETINE HCL 20 MG PO CPEP
20.0000 mg | ORAL_CAPSULE | Freq: Once | ORAL | Status: AC
  Administered 2016-09-01: 20 mg via ORAL
  Filled 2016-09-01: qty 1

## 2016-09-01 NOTE — Progress Notes (Signed)
Adult Psychoeducational Group Note  Date:  09/01/2016 Time:  0845 am  Group Topic/Focus:  Orientation:   The focus of this group is to educate the patient on the purpose and policies of crisis stabilization and provide a format to answer questions about their admission.  The group details unit policies and expectations of patients while admitted.   Participation Level:  Did Not Attend  Participation Quality:    Affect:    Cognitive:    Insight:   Engagement in Group:    Modes of Intervention:    Additional Comments:   Isis Costanza L 09/01/2016, 9:17 AM

## 2016-09-01 NOTE — Progress Notes (Signed)
[facility] MD Progress Note  09/01/2016 12:15 PM Jill Cardenas  MRN:  [id number] Subjective:  Patient was staffed with treatmant team who report patient denyiing suicide ideation. Patient reports improving mood and less depressed mood.. Patient is tolerating the cymbalta well and is okay with taking the 40mg  all at once.She continues to think she does not need to be here and believes she will benefit more from outpatient therapy than being here because "there are people who have been here more than 2-3 yimes and I am not one of those' Principal Problem: <principal problem not specified> Diagnosis:   Patient Active Problem List   Diagnosis Date Noted  . MDD (major depressive disorder), recurrent episode, severe (HCC) [F33.2] 08/31/2016  . HNP (herniated nucleus pulposus), lumbar [M51.26] 11/05/2014  . 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 Cholecystitis   . Depression   . Kidney stones     Past Surgical History:  Procedure Laterality Date  . ANKLE ARTHROSCOPY W/ OPEN REPAIR Right   . cesarian    . LUMBAR LAMINECTOMY/DECOMPRESSION MICRODISCECTOMY Left 11/05/2014   Procedure: LUMBAR LAMINECTOMY/DECOMPRESSION MICRODISCECTOMY 1 LEVEL LUMBAR 4-5;  Surgeon: Temple Pacini, MD;  Location: [facility] OR;  Service: Neurosurgery;  Laterality: Left;  LUMBAR LAMINECTOMY/DECOMPRESSION MICRODISCECTOMY 1 LEVEL LUMBAR 4-5  . TUBAL LIGATION     Family History:  Family History  Problem Relation Age of Onset  . Emphysema Mother    Family Psychiatric  History: see admission assessment Social History:  History  Alcohol Use No     History  Drug Use No    Social History   Social History  . Marital status: Single    Spouse name: N/A  . Number of children: N/A  . Years of education: N/A   Social History Main Topics  .  Smoking status: Current Every Day Smoker    Packs/day: 1.00    Types: Cigarettes  . Smokeless tobacco: Never Used  . Alcohol use No  . Drug use: No  . Sexual activity: Not Asked   Other Topics Concern  . None   Social History Narrative  . None   Additional Social History:                         Sleep: Fair  Appetite:  Fair  Current Medications: Current Facility-Administered Medications  Medication Dose Route Frequency Provider Last Rate Last Dose  . alum & mag hydroxide-simeth (MAALOX/MYLANTA) 200-200-20 MG/5ML suspension 30 mL  30 mL Oral Q4H PRN Kerry Hough, PA-C      . [START ON 09/02/2016] DULoxetine (CYMBALTA) DR capsule 40 mg  40 mg Oral Daily Molinda Bailiff, MD      . feeding supplement (BOOST / RESOURCE BREEZE) liquid 1 Container  1 Container Oral BID BM Rockey Situ Cobos, MD      . hydrOXYzine (ATARAX/VISTARIL) tablet 25 mg  25 mg Oral Q6H PRN Kerry Hough, PA-C      . magnesium hydroxide (MILK OF MAGNESIA) suspension 30 mL  30 mL Oral Daily PRN Kerry Hough, PA-C      . methocarbamol (ROBAXIN) tablet 500 mg  500 mg Oral Q6H PRN Kerry Hough, PA-C      . naproxen (NAPROSYN) tablet 250 mg  250 mg Oral BID WC Kerry Hough,  PA-C      . traZODone (DESYREL) tablet 50 mg  50 mg Oral QHS,MR X 1 Kerry Hough, PA-C        Lab Results:  Results for orders placed or performed during the hospital encounter of 08/31/16 (from the past 48 hour(s))  Lipid panel     Status: Abnormal   Collection Time: 09/01/16  6:05 AM  Result Value Ref Range   Cholesterol 203 (H) 0 - 200 mg/dL   Triglycerides 409 <811 mg/dL   HDL 36 (L) >91 mg/dL   Total CHOL/HDL Ratio 5.6 RATIO   VLDL 26 0 - 40 mg/dL   LDL Cholesterol 478 (H) 0 - 99 mg/dL    Comment:        Total Cholesterol/HDL:CHD Risk Coronary Heart Disease Risk Table                     Men   Women  1/2 Average Risk   3.4   3.3  Average Risk       5.0   4.4  2 X Average Risk   9.6   7.1  3 X Average Risk   23.4   11.0        Use the calculated Patient Ratio above and the CHD Risk Table to determine the patient's CHD Risk.        ATP III CLASSIFICATION (LDL):  <100     mg/dL   Optimal  295-621  mg/dL   Near or Above                    Optimal  130-159  mg/dL   Borderline  308-657  mg/dL   High  >846     mg/dL   Very High Performed at [facility]   TSH     Status: None   Collection Time: 09/01/16  6:05 AM  Result Value Ref Range   TSH 0.774 0.350 - 4.500 uIU/mL    Comment: Performed by a 3rd Generation assay with a functional sensitivity of <=0.01 uIU/mL. Performed at [facility]   Acetaminophen level     Status: Abnormal   Collection Time: 09/01/16  6:05 AM  Result Value Ref Range   Acetaminophen (Tylenol), Serum <10 (L) 10 - 30 ug/mL    Comment:        THERAPEUTIC CONCENTRATIONS VARY SIGNIFICANTLY. A RANGE OF 10-30 ug/mL MAY BE AN EFFECTIVE CONCENTRATION FOR MANY PATIENTS. HOWEVER, SOME ARE BEST TREATED AT CONCENTRATIONS OUTSIDE THIS RANGE. ACETAMINOPHEN CONCENTRATIONS >150 ug/mL AT 4 HOURS AFTER INGESTION AND >50 ug/mL AT 12 HOURS AFTER INGESTION ARE OFTEN ASSOCIATED WITH TOXIC REACTIONS. Performed at [facility]     Blood Alcohol level:  Lab Results  Component Value Date   [facility] <5 08/30/2016    Metabolic Disorder Labs: No results found for: HGBA1C, MPG No results found for: PROLACTIN Lab Results  Component Value Date   CHOL 203 (H) 09/01/2016   TRIG 132 09/01/2016   HDL 36 (L) 09/01/2016   CHOLHDL 5.6 09/01/2016   VLDL 26 09/01/2016   LDLCALC 141 (H) 09/01/2016    Physical Findings: AIMS: Facial and Oral Movements Muscles of Facial Expression: None, normal Lips and Perioral Area: None, normal Jaw: None, normal Tongue: None, normal,Extremity Movements Upper (arms, wrists, hands, fingers): None, normal Lower (legs, knees, ankles, toes): None, normal, Trunk Movements Neck, shoulders, hips: None, normal,  Overall Severity Severity of abnormal movements (highest score from  questions above): None, normal Incapacitation due to abnormal movements: None, normal Patient's awareness of abnormal movements (rate only patient's report): No Awareness, Dental Status Current problems with teeth and/or dentures?: No Does patient usually wear dentures?: No  CIWA:    COWS:     Musculoskeletal: Strength & Muscle Tone: within normal limits Gait & Station: normal Patient leans: N/A  Psychiatric Specialty Exam: Physical Exam  ROS  Blood pressure (!) 116/56, pulse (!) 126, temperature 98.2 F (36.8 C), temperature source Oral, resp. rate 18, height 5\' 3"  (1.6 m), weight 62.6 kg (138 lb), last menstrual period 07/28/2016.Body mass index is 24.45 kg/m.  General Appearance: Casual  Eye Contact:  Good  Speech:  Clear and Coherent and Normal Rate  Volume:  Normal  Mood:  Irritable  Affect:  Congruent  Thought Process:  Coherent and Goal Directed  Orientation:  Full (Time, Place, and Person)  Thought Content:  Logical  Suicidal Thoughts:  No  Homicidal Thoughts:  No  Memory:  Immediate;   Fair Recent;   Fair Remote;   Fair  Judgement:  Other:  improving  Insight:  Fair  Psychomotor Activity:  Normal  Concentration:  Concentration: Fair and Attention Span: Fair  Recall:  FiservFair  Fund of Knowledge:  Fair  Language:  Fair  Akathisia:  No  Handed:  Right  AIMS (if indicated):     Assets:  Communication Skills Desire for Improvement Housing Intimacy Physical Health Transportation  ADL's:  Intact  Cognition:  WNL  Sleep:  Number of Hours: 6     Treatment Plan Summary: Daily contact with patient to assess and evaluate symptoms and progress in treatment and Medication management  Continue cymbalta 40mg  daily Wynelle BourgeoisUreh N Lekauwa, MD 09/01/2016, 12:15 PM

## 2016-09-01 NOTE — Progress Notes (Signed)
D: Pt presents animated on approach. Pt mood labile this morning.  Pt appears anxious and irritable. Pt rates depression 5/10. Anxiety 5/10. Pt denies suicidal thoughts and verbally contracts for safety. Pt reported that she feels less depressed after starting antidepressant. Pt appears withdrawn and isolates in her room.  A: Medications reviewed with pt. Medications administered as ordered per MD. Verbal support provided. Pt encouraged to attend groups and participate. Pt progress discussed in treatment team this morning. Per MD., writer may modify Cymbalta so that pt takes 40 mg daily, starting tomorrow. R: Pt compliant with taking meds. No side effects to meds verbalized by pt.

## 2016-09-01 NOTE — [facility] Group Notes (Signed)
[facility] Group Therapy 09/01/2016 1:15pm  Type of Therapy: Mental Health Association Presentation  Participation Level: Active  Participation Quality: Attentive  Affect: Appropriate  Cognitive: Oriented  Insight: Developing/Improving  Engagement in Therapy: Engaged  Modes of Intervention: Discussion, Education and Socialization  Summary of Progress/Problems: Mental Health Association (MHA) Speaker came to talk about his personal journey with substance abuse and addiction. The pt processed ways by which to relate to the speaker. MHA speaker provided handouts and educational information pertaining to groups and services offered by the MHA. Pt was engaged in speaker's presentation and was receptive to resources provided.    Zalea Pete Carter, LCSWA 09/01/2016 1:51 PM  

## 2016-09-01 NOTE — Plan of Care (Signed)
Problem: Safety: Goal: Periods of time without injury will increase Outcome: Progressing Client has remain free form injury AEB q7015min safety checks, and denial of SI. Client is safe on the unit.

## 2016-09-01 NOTE — [facility] Suicide Risk Assessment (Signed)
[facility] INPATIENT:  Family/Significant Other Suicide Prevention Education  Suicide Prevention Education:  Education Completed; Jill Cardenas, Pt's boyfriend [phone],  (name of family member/significant other) has been identified by the patient as the family member/significant other with whom the patient will be residing, and identified as the person(s) who will aid the patient in the event of a mental health crisis (suicidal ideations/suicide attempt).  With written consent from the patient, the family member/significant other has been provided the following suicide prevention education, prior to the and/or following the discharge of the patient.  The suicide prevention education provided includes the following:  Suicide risk factors  Suicide prevention and interventions  National Suicide Hotline telephone number  [facility] assessment telephone number  [facility] 911  [facility]County and/or Residential Mobile Crisis Unit telephone number  Request made of family/significant other to:  Remove weapons (e.g., guns, rifles, knives), all items previously/currently identified as safety concern.    Remove drugs/medications (over-the-counter, prescriptions, illicit drugs), all items previously/currently identified as a safety concern.  The family member/significant other verbalizes understanding of the suicide prevention education information provided.  The family member/significant other agrees to remove the items of safety concern listed above.  Jill Cardenas 09/01/2016, 3:13 PM

## 2016-09-01 NOTE — Plan of Care (Signed)
Problem: Activity: Goal: Interest or engagement in leisure activities will improve Outcome: Progressing Pt observed engaging in dayroom this afternoon. Pt attended afternoon groups. Pt less isolative this afternoon.

## 2016-09-01 NOTE — Progress Notes (Signed)
Patient ID: Jill MinaHeather R Cardenas, female   DOB: [birth year], 30 y.o.   MRN: [id number] D: Client seen hugged up with visitor tonight. Client appears calm, relaxed. Client seen interacting with peers also. Client expressed "things looking better" "depression, but that's normal cause I can't be home with my children" A: Writer provided emotional support, encouraged client to attend karaoke. Client initially declined, but change her mind. Staff will monitor q4415min for safety. R: Client is safe on the unit, attended karaoke.

## 2016-09-02 LAB — HEMOGLOBIN A1C
HEMOGLOBIN A1C: 5 % (ref 4.8–5.6)
MEAN PLASMA GLUCOSE: 97 mg/dL

## 2016-09-02 MED ORDER — DULOXETINE HCL 40 MG PO CPEP: 40.0000 mg | ORAL_CAPSULE | Freq: Every day | ORAL | 0 refills | Status: DC

## 2016-09-02 MED ORDER — HYDROXYZINE HCL 25 MG PO TABS: 25.0000 mg | ORAL_TABLET | Freq: Four times a day (QID) | ORAL | 0 refills | Status: DC | PRN

## 2016-09-02 MED ORDER — TRAZODONE HCL 50 MG PO TABS: 50.0000 mg | ORAL_TABLET | Freq: Every evening | ORAL | 0 refills | Status: DC | PRN

## 2016-09-02 NOTE — Progress Notes (Signed)
  [facility] :  Will you be returning to the same living situation after discharge:  Yes,  Pt returning home At discharge, do you have transportation home?: Yes,  Pt significant other to pick up Do you have the ability to pay for your medications: Yes,  Pt provided with prescriptions  Release of information consent forms completed and in the chart;  Patient's signature needed at discharge.  Patient to Follow up at: Follow-up Information    [facility] Follow up on 09/05/2016.   Why:  at 3:00pm for therapy with Gunnar FusiPaula. Contact information: [address]  [place], [place] [address]  [phone]  Fax: [phone]         [facility] .   Why:  October 19th at 9:30 am your initial evaluation. October 31st at 10:30 am for medication management with Milana Obeyeresa Francis. Contact information: [address] [place] [address] [phone]          Next level of care provider has access to [facility] Link:no  Safety Planning and Suicide Prevention discussed: Yes,  with boyfriend; see SPE note  Have you used any form of tobacco in the last 30 days? (Cigarettes, Smokeless Tobacco, Cigars, and/or Pipes): Yes  Has patient been referred to the Quitline?: Patient refused referral  Patient has been referred for addiction treatment: Yes  Elaina HoopsLauren M Carter 09/02/2016, 12:42 PM

## 2016-09-02 NOTE — [facility] Suicide Risk Assessment (Signed)
[facility]   Principal Problem: <principal problem not specified> Discharge Diagnoses:  Patient Active Problem List   Diagnosis Date Noted  . MDD (major depressive disorder), recurrent episode, severe (HCC) [F33.2] 08/31/2016  . HNP (herniated nucleus pulposus), lumbar [M51.26] 11/05/2014  . 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 rate 18, height 5\' 3"  (1.6 m), weight 62.6 kg (138 lb), last menstrual period 07/28/2016.Body mass index is 24.45 kg/m.  General Appearance: Casual  Eye Contact::  Good  Speech:  Clear and Coherent and Normal Rate409  Volume:  Normal  Mood:  Euthymic  Affect:  Congruent  Thought Process:  Coherent and Goal Directed  Orientation:  Full (Time, Place, and Person)  Thought Content:  Logical  Suicidal Thoughts:  No  Homicidal Thoughts:  No  Memory:  Immediate;   Good Recent;   Good Remote;   Good  Judgement:  Fair  Insight:  Fair  Psychomotor Activity:  Normal  Concentration:  Good  Recall:  Good  Fund of Knowledge:Good  Language: Good  Akathisia:  No  Handed:  Right  AIMS (if indicated):     Assets:  Communication Skills Desire for Improvement Financial Resources/Insurance Housing Intimacy Physical Health Transportation Vocational/Educational  Sleep:  Number of Hours: 6.75  Cognition: WNL  ADL's:  Intact   Mental Status Per Nursing Assessment::   On Admission:     Demographic Factors:  Caucasian  Loss Factors: NA  Historical Factors: Prior suicide attempts, Family history of suicide and Family history of mental illness or substance abuse  Risk Reduction Factors:   Responsible for children under  31 years of age, Sense of responsibility to family, Employed, Living with another person, especially a relative and Positive social support  Continued Clinical Symptoms:  None  Cognitive Features That Contribute To Risk:  None    Suicide Risk:  Minimal: No identifiable suicidal ideation.  Patients presenting with no risk factors but with morbid ruminations; may be classified as minimal risk based on the severity of the depressive symptoms  Follow-up Information    [facility] Follow up on 09/05/2016.   Why:  at 3:00pm for therapy with Gunnar FusiPaula. Contact information: [address]  [place], [place] [address]  [phone]  Fax: [phone]            Plan Of Care/Follow-up recommendations:  Activity:  Patient will like to continue taking her medications on an outpatient basis    Wynelle BourgeoisUreh N Lekauwa, MD 09/02/2016, 10:04 AM

## 2016-09-02 NOTE — Progress Notes (Signed)
Recreation Therapy Notes  Date: 09/02/16 Time: 0930 Location: 300 [address] Dayroom  Group Topic: Stress Management  Goal Area(s) Addresses:  Patient will verbalize importance of using healthy stress management.  Patient will identify positive emotions associated with healthy stress management.   Intervention: Guided Imagery Script  Activity :  [facility] Visual[facility].  LRT introduced the stress management technique of guided imagery.  LRT read a script that allowed patients to participate in the activity.  Patients were to follow along as LRT read script.    Education:  Stress Management, Discharge Planning.   Education Outcome: Acknowledges edcuation/In group clarification offered/Needs additional education  Clinical Observations/Feedback: Pt did not attend group.     Caroll RancherMarjette Ellissa Ayo, LRT/CTRS         Caroll RancherLindsay, Jenaro Souder A 09/02/2016 12:46 PM

## 2016-09-02 NOTE — Progress Notes (Signed)
Pt is readied for dc as she  Is given her dc info, this is discussed and reviewed with her by Clinical research associatewriter  and she states understanding. She completes her daily assessment and on it she writes she denies SI today and she rates her depression, hopelessness and anxiety " 3/0/3", respectively. All belongings are returned to her and she signed release of property returned.She is given prescriptions per MD and all dc paperwork is given to her as she exits ( AVS, SRA, transition record and cc of the suicde safety plan she comprised). DC complete.

## 2016-09-02 NOTE — Discharge Summary (Signed)
Physician Discharge Summary Note  Patient:  Jill Cardenas is an 30 y.o., female MRN:  [id number] DOB:  Jan 20, 1986 Patient phone:  [phone] (home)  Patient address:   [address] [place] [address],  Total Time spent with patient: 30 minutes  Date of Admission:  08/31/2016 Date of Discharge: 09/02/2016  Reason for Admission:  Overdose   Principal Problem: MDD (major depressive disorder), recurrent episode, severe Jill Cardenas) Discharge Diagnoses: Patient Active Problem List   Diagnosis Date Noted  . MDD (major depressive disorder), recurrent episode, severe (HCC) [F33.2] 08/31/2016    Priority: High  . HNP (herniated nucleus pulposus), lumbar [M51.26] 11/05/2014  . 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 Cholecystitis   . Depression   . Kidney stones     Past Surgical History:  Procedure Laterality Date  . ANKLE ARTHROSCOPY W/ OPEN REPAIR Right   . cesarian    . LUMBAR LAMINECTOMY/DECOMPRESSION MICRODISCECTOMY Left 11/05/2014   Procedure: LUMBAR LAMINECTOMY/DECOMPRESSION MICRODISCECTOMY 1 LEVEL LUMBAR 4-5;  Surgeon: Temple Pacini, MD;  Location: [facility] OR;  Service: Neurosurgery;  Laterality: Left;  LUMBAR LAMINECTOMY/DECOMPRESSION MICRODISCECTOMY 1 LEVEL LUMBAR 4-5  . TUBAL LIGATION     Family History:  Family History  Problem Relation Age of Onset  . Emphysema Mother    Family Psychiatric  History: see HPI Social History:  History  Alcohol Use No     History  Drug Use No    Social History   Social History  . Marital status: Single    Spouse name: N/A  . Number of children: N/A  . Years of education: N/A   Social History Main Topics  . Smoking status: Current Every Day Smoker    Packs/day: 1.00    Types: Cigarettes  . Smokeless tobacco: Never Used  . Alcohol use No  . Drug use: No  . Sexual activity: Not Asked    Other Topics Concern  . None   Social History Narrative  . None    Cardenas Course:  Jill Cardenas, 30 year old single female overdosed on 10 tablets of 3/325 vicodin because of 2 week history of increasing relationship conflict and depressive symptoms.  Jill Cardenas was admitted for MDD (major depressive disorder), recurrent episode, severe (HCC) and crisis management.  Patient was treated with medications with their indications listed below in detail under Medication List.  Medical problems were identified and treated as needed.  Home medications were restarted as appropriate.  Improvement was monitored by observation and Jill Cardenas daily report of symptom reduction.  Emotional and mental status was monitored by daily self inventory reports completed by Jill Cardenas and clinical staff.  Patient reported continued improvement, denied any new concerns.  Patient had been compliant on medications and denied side effects.  Support and encouragement was provided.    Patient encouraged to attend groups to help with recognizing triggers of emotional crises and de-stabilizations.  Patient encouraged to attend group to help identify the positive things in life that would help in dealing with feelings of loss, depression and unhealthy or abusive tendencies.         Jill Cardenas was evaluated by the treatment team for stability and plans for continued recovery upon discharge.  Patient was offered further treatment options upon discharge including Residential, Intensive Outpatient and Outpatient treatment. Patient will follow up with agency  listed below for medication management and counseling.  Encouraged patient to maintain satisfactory support network and home environment.  Advised to adhere to medication compliance and outpatient treatment follow up.  Prescriptions provided.       Jill Cardenas motivation was an integral factor for scheduling further treatment.  Employment,  transportation, bed availability, health status, family support, and any pending legal issues were also considered during patient's Cardenas stay.  Upon completion of this admission the patient was both mentally and medically stable for discharge denying suicidal/homicidal ideation, auditory/visual/tactile hallucinations, delusional thoughts and paranoia.      Physical Findings: AIMS: Facial and Oral Movements Muscles of Facial Expression: None, normal Lips and Perioral Area: None, normal Jaw: None, normal Tongue: None, normal,Extremity Movements Upper (arms, wrists, hands, fingers): None, normal Lower (legs, knees, ankles, toes): None, normal, Trunk Movements Neck, shoulders, hips: None, normal, Overall Severity Severity of abnormal movements (highest score from questions above): None, normal Incapacitation due to abnormal movements: None, normal Patient's awareness of abnormal movements (rate only patient's report): No Awareness, Dental Status Current problems with teeth and/or dentures?: No Does patient usually wear dentures?: No  CIWA:    COWS:     Musculoskeletal: Strength & Muscle Tone: within normal limits Gait & Station: normal Patient leans: N/A  Psychiatric Specialty Exam: Physical Exam  Nursing note and vitals reviewed. Psychiatric: She has a normal mood and affect. Her behavior is normal. Judgment and thought content normal. Her mood appears not anxious. Thought content is not paranoid. Cognition and memory are normal. She expresses no homicidal and no suicidal ideation.    Review of Systems  Constitutional: Negative.   HENT: Negative.   Eyes: Negative.   Respiratory: Negative.   Cardiovascular: Negative.   Gastrointestinal: Negative.   Genitourinary: Negative.   Musculoskeletal: Negative.   Skin: Negative.   Neurological: Negative.   Endo/Heme/Allergies: Negative.   Psychiatric/Behavioral: Negative.  Negative for depression, hallucinations and suicidal ideas.   All other systems reviewed and are negative.   Blood pressure (!) 117/98, pulse 97, temperature 98 F (36.7 C), temperature source Oral, resp. rate 18, height 5\' 3"  (1.6 m), weight 62.6 kg (138 lb), last menstrual period 07/28/2016.Body mass index is 24.45 kg/m.    Have you used any form of tobacco in the last 30 days? (Cigarettes, Smokeless Tobacco, Cigars, and/or Pipes): Yes  Has this patient used any form of tobacco in the last 30 days? (Cigarettes, Smokeless Tobacco, Cigars, and/or Pipes) Yes, N/A  Blood Alcohol level:  Lab Results  Component Value Date   ETH <5 08/30/2016    Metabolic Disorder Labs:  Lab Results  Component Value Date   HGBA1C 5.0 09/01/2016   MPG 97 09/01/2016   No results found for: PROLACTIN Lab Results  Component Value Date   CHOL 203 (H) 09/01/2016   TRIG 132 09/01/2016   HDL 36 (L) 09/01/2016   CHOLHDL 5.6 09/01/2016   VLDL 26 09/01/2016   LDLCALC 141 (H) 09/01/2016    See Psychiatric Specialty Exam and Suicide Risk Assessment completed by Attending Physician prior to discharge.  Discharge destination:  Home  Is patient on multiple antipsychotic therapies at discharge:  No   Has Patient had three or more failed trials of antipsychotic monotherapy by history:  No  Recommended Plan for Multiple Antipsychotic Therapies: NA     Medication List    STOP taking these medications   acetaminophen 500 MG tablet Commonly known as:  TYLENOL   carisoprodol 350 MG tablet Commonly  known as:  SOMA   HYDROcodone-acetaminophen 5-325 MG tablet Commonly known as:  NORCO/VICODIN   methocarbamol 500 MG tablet Commonly known as:  ROBAXIN   naproxen 250 MG tablet Commonly known as:  NAPROSYN   oxyCODONE-acetaminophen 5-325 MG tablet Commonly known as:  PERCOCET/ROXICET     TAKE these medications     Indication  DULoxetine HCl 40 MG Cpep Take 40 mg by mouth daily.  Indication:  Major Depressive Disorder   hydrOXYzine 25 MG tablet Commonly  known as:  ATARAX/VISTARIL Take 1 tablet (25 mg total) by mouth every 6 (six) hours as needed for anxiety.  Indication:  Anxiety Neurosis   traZODone 50 MG tablet Commonly known as:  DESYREL Take 1 tablet (50 mg total) by mouth at bedtime and may repeat dose one time if needed.  Indication:  Trouble Sleeping      Follow-up Information    [facility] Follow up on 09/05/2016.   Why:  at 3:00pm for therapy with Gunnar FusiPaula. Contact information: [address]  [place], [place] [address]  [phone]  Fax: [phone]         [facility] .   Why:  October 19th at 9:30 am your initial evaluation. October 31st at 10:30 am for medication management with Milana Obeyeresa Francis. Contact information: [address] [place] [address] [phone]          Follow-up recommendations:  Activity:  as tol Diet:  as tol  Comments:  1.  Take all your medications as prescribed.   2.  Report any adverse side effects to outpatient provider. 3.  Patient instructed to not use alcohol or illegal drugs while on prescription medicines. 4.  In the event of worsening symptoms, instructed patient to call 911, the crisis hotline or go to nearest emergency room for evaluation of symptoms.  Signed: Lindwood QuaSheila May Esmirna Ravan, NP [facility] 09/02/2016, 3:55 PM

## 2016-10-17 ENCOUNTER — Emergency Department (HOSPITAL_COMMUNITY)
Admission: EM | Admit: 2016-10-17 | Discharge: 2016-10-17 | Disposition: A | Discharge status: Home / Self Care | Payer: BLUE CROSS/BLUE SHIELD | Attending: Emergency Medicine | Admitting: Emergency Medicine

## 2016-10-17 ENCOUNTER — Encounter (HOSPITAL_COMMUNITY): Payer: Self-pay | Admitting: *Deleted

## 2016-10-17 DIAGNOSIS — M5431 Sciatica, right side: Secondary | ICD-10-CM

## 2016-10-17 DIAGNOSIS — Z91040 Latex allergy status: Secondary | ICD-10-CM | POA: Diagnosis not present

## 2016-10-17 DIAGNOSIS — F17210 Nicotine dependence, cigarettes, uncomplicated: Secondary | ICD-10-CM | POA: Diagnosis not present

## 2016-10-17 HISTORY — DX: Sciatica, unspecified side: M54.30

## 2016-10-17 MED ORDER — METHOCARBAMOL 500 MG PO TABS
1000.0000 mg | ORAL_TABLET | Freq: Once | ORAL | Status: AC
  Administered 2016-10-17: 1000 mg via ORAL
  Filled 2016-10-17: qty 2

## 2016-10-17 MED ORDER — METHOCARBAMOL 500 MG PO TABS: 500.0000 mg | ORAL_TABLET | Freq: Two times a day (BID) | ORAL | 0 refills | Status: DC

## 2016-10-17 MED ORDER — HYDROMORPHONE HCL 2 MG/ML IJ SOLN
1.0000 mg | Freq: Once | INTRAMUSCULAR | Status: AC
  Administered 2016-10-17: 1 mg via INTRAMUSCULAR
  Filled 2016-10-17: qty 1

## 2016-10-17 MED ORDER — KETOROLAC TROMETHAMINE 60 MG/2ML IM SOLN
60.0000 mg | Freq: Once | INTRAMUSCULAR | Status: AC
  Administered 2016-10-17: 60 mg via INTRAMUSCULAR
  Filled 2016-10-17: qty 2

## 2016-10-17 MED ORDER — NAPROXEN 500 MG PO TABS: 500.0000 mg | ORAL_TABLET | Freq: Two times a day (BID) | ORAL | 0 refills | Status: DC

## 2016-10-17 MED ORDER — DEXAMETHASONE SODIUM PHOSPHATE 10 MG/ML IJ SOLN
10.0000 mg | Freq: Once | INTRAMUSCULAR | Status: AC
  Administered 2016-10-17: 10 mg via INTRAMUSCULAR
  Filled 2016-10-17: qty 1

## 2016-10-17 NOTE — ED Triage Notes (Signed)
Dilaudid 1 mg wasted after PT discontinued. Karrie DoffingM. Scruggs RN

## 2016-10-17 NOTE — ED Notes (Signed)
Pt.state high level of pain

## 2016-10-17 NOTE — ED Notes (Signed)
Declined W/C at D/C and was escorted to lobby by RN. 

## 2016-10-17 NOTE — ED Triage Notes (Signed)
PT states hx of sciatica and is experiencing R lower back pain that radiates down to R foot.

## 2016-10-17 NOTE — ED Triage Notes (Signed)
PT ambulated to bathroom without difficulty 

## 2016-10-17 NOTE — ED Provider Notes (Signed)
[facility]-EM[facility] DEPT Provider Note   CSN: [id number] Arrival date & time: 10/17/16  [id number]  By signing my name below, I, Placido SouLogan Joldersma, attest that this documentation has been prepared under the direction and in the presence of Cheri FowlerKayla Lataja Newland, PA-C. Electronically Signed: Placido SouLogan Joldersma, ED Scribe. 10/17/16. 9:18 AM.   History   Chief Complaint Chief Complaint  Patient presents with  . Sciatica    HPI HPI Comments: Jill Cardenas is a 30 y.o. female with a h/o sciatica who presents to the Emergency Department complaining of worsening, moderate, atraumatic, right lower back pain x 1 day. Her pain radiates down her RLE with associated weakness. She confirms her pain is consistent with prior sciatica exacerbations. Pt confirms being ambulatory noting her pain slightly alleviates with ambulation. She took 1x robaxin and 2x gabapentin which provided mild short term relief. She confirms her listed surgical history. Pt confirms a h/o tubal ligation and denies any possibility of being pregnant. No h/o IVDA. Pt states that flexeril causes her to experience "anger". She denies bowel or bladder incontinence, urinary difficulty, gait abnormalities, abdominal pain, fever, chills or other associated symptoms at this time.    The history is provided by the patient. No language interpreter was used.   Past Medical History:  Diagnosis Date  . Cholecystitis   . Depression   . Kidney stones   . Sciatica     Patient Active Problem List   Diagnosis Date Noted  . MDD (major depressive disorder), recurrent episode, severe (HCC) 08/31/2016  . HNP (herniated nucleus pulposus), lumbar 11/05/2014  . 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 ANKLE ARTHROSCOPY W/ OPEN REPAIR Right   . cesarian    . LUMBAR LAMINECTOMY/DECOMPRESSION MICRODISCECTOMY Left 11/05/2014   Procedure: LUMBAR LAMINECTOMY/DECOMPRESSION MICRODISCECTOMY 1 LEVEL LUMBAR  4-5;  Surgeon: Temple PaciniHenry A Pool, MD;  Location: [facility] OR;  Service: Neurosurgery;  Laterality: Left;  LUMBAR LAMINECTOMY/DECOMPRESSION MICRODISCECTOMY 1 LEVEL LUMBAR 4-5  . TUBAL LIGATION      OB History    No data available       Home Medications    Prior to Admission medications   Medication Sig Start Date End Date Taking? Authorizing Provider  busPIRone (BUSPAR) 15 MG tablet Take 15 mg by mouth 3 (three) times daily.   Yes Historical Provider, MD  gabapentin (NEURONTIN) 100 MG capsule Take 100 mg by mouth at bedtime.   Yes Historical Provider, MD  propranolol (INDERAL) 20 MG tablet Take 20 mg by mouth 4 (four) times daily.   Yes Historical Provider, MD  sertraline (ZOLOFT) 100 MG tablet Take 100 mg by mouth daily.   Yes Historical Provider, MD  DULoxetine 40 MG CPEP Take 40 mg by mouth daily. 09/02/16   Adonis BrookSheila Agustin, NP  hydrOXYzine (ATARAX/VISTARIL) 25 MG tablet Take 1 tablet (25 mg total) by mouth every 6 (six) hours as needed for anxiety. 09/02/16   Adonis BrookSheila Agustin, NP  methocarbamol (ROBAXIN) 500 MG tablet Take 1 tablet (500 mg total) by mouth 2 (two) times daily. 10/17/16   Cheri FowlerKayla Travonna Swindle, PA-C  naproxen (NAPROSYN) 500 MG tablet Take 1 tablet (500 mg total) by mouth 2 (two) times daily. 10/17/16   Cheri FowlerKayla Tamikka Pilger, PA-C  traZODone (DESYREL) 50 MG tablet Take 1 tablet (50 mg total) by mouth at bedtime and may repeat dose one time if needed. 09/02/16   Adonis BrookSheila Agustin, NP    Family History Family History  Problem Relation Age of  Onset  . Emphysema Mother     Social History Social History  Substance Use Topics  . Smoking status: Current Every Day Smoker    Packs/day: 1.00    Types: Cigarettes  . Smokeless tobacco: Never Used  . Alcohol use No     Allergies   Latex   Review of Systems Review of Systems  Constitutional: Negative for chills and fever.  Gastrointestinal: Negative for abdominal pain.  Genitourinary: Negative for difficulty urinating.  Musculoskeletal: Positive for  back pain and myalgias.  Neurological: Positive for weakness. Negative for numbness.  All other systems reviewed and are negative.  Physical Exam Updated Vital Signs BP 119/76 (BP Location: Right Arm)   Pulse 88   Temp 98.4 F (36.9 C) (Oral)   Resp 16   Ht 5\' 3"  (1.6 m)   Wt 62.6 kg   LMP 09/29/2016   SpO2 99%   BMI 24.45 kg/m   Physical Exam  Constitutional: She is oriented to person, place, and time. She appears well-developed and well-nourished.  HENT:  Head: Atraumatic.  Eyes: Conjunctivae are normal.  Cardiovascular: Normal rate, regular rhythm, normal heart sounds and intact distal pulses.   Pulses:      Dorsalis pedis pulses are 2+ on the right side, and 2+ on the left side.  Pulmonary/Chest: Effort normal and breath sounds normal.  Abdominal: Soft. Bowel sounds are normal. She exhibits no distension. There is no tenderness.  Musculoskeletal: She exhibits tenderness (along sciatic notch).  No spinous process tenderness.  No step offs. No crepitus.  Neurological: She is alert and oriented to person, place, and time.  No saddle anesthesia. Normal sensation.  4-5/5 strength on the left lower extremity; 5/5 on the right.  Normal gait.   Skin: Skin is warm and dry.  Psychiatric: She has a normal mood and affect. Her behavior is normal.   ED Treatments / Results  Labs (all labs ordered are listed, but only abnormal results are displayed) Labs Reviewed - No data to display  EKG  EKG Interpretation None       Radiology No results found.  Procedures Procedures  DIAGNOSTIC STUDIES: Oxygen Saturation is 97% on RA, normal by my interpretation.    COORDINATION OF CARE: 9:17 AM Discussed next steps with pt. Pt verbalized understanding and is agreeable with the plan.    Medications Ordered in ED Medications  ketorolac (TORADOL) injection 60 mg (60 mg Intramuscular Given 10/17/16 0937)  dexamethasone (DECADRON) injection 10 mg (10 mg Intramuscular Given 10/17/16  0938)  methocarbamol (ROBAXIN) tablet 1,000 mg (1,000 mg Oral Given 10/17/16 0949)  HYDROmorphone (DILAUDID) injection 1 mg (1 mg Intramuscular Given 10/17/16 1043)     Initial Impression / Assessment and Plan / ED Course  I have reviewed the triage vital signs and the nursing notes.  Pertinent labs & imaging results that were available during my care of the patient were reviewed by me and considered in my medical decision making (see chart for details).  Clinical Course     Patient with back pain.  Patient with mild weakness of right lower extremity on initial exam, I suspect secondary to pain.  Repeat neurological exam after pain medication improved.  No focal neurological deficits, 5/5 strength throughout bilateral lower extremities, no focal neurological deficits.  Patient is ambulatory.  No loss of bowel or bladder control.  No concern for cauda equina.  No fever, night sweats, weight loss, h/o cancer, IVDA, no recent procedure to back. No urinary symptoms suggestive  of UTI.  Patient has neurosurgeon in the area and will closely follow up. No indication for emergent MRI at this time given benign neurological exam.  Supportive care and return precaution discussed. Appears safe for discharge at this time. Follow up as indicated in discharge paperwork.   I personally performed the services described in this documentation, which was scribed in my presence. The recorded information has been reviewed and is accurate.  Final Clinical Impressions(s) / ED Diagnoses   Final diagnoses:  Right sided sciatica    New Prescriptions New Prescriptions   METHOCARBAMOL (ROBAXIN) 500 MG TABLET    Take 1 tablet (500 mg total) by mouth 2 (two) times daily.   NAPROXEN (NAPROSYN) 500 MG TABLET    Take 1 tablet (500 mg total) by mouth 2 (two) times daily.       Cheri FowlerKayla Sevastian Witczak, PA-C 10/17/16 1115    Derwood KaplanAnkit Nanavati, MD 10/22/16 [phone]

## 2016-10-17 NOTE — Discharge Instructions (Signed)
Start taking Naprosyn twice daily.  Take Robaxin twice daily as needed for pain.  Follow up with your neurosurgeon.  Return to the ED for sudden worsening pain, difficulty walking, worsening numbness or weakness, or any new or concerning symptoms.

## 2016-10-19 ENCOUNTER — Other Ambulatory Visit: Payer: Self-pay | Admitting: Obstetrics and Gynecology

## 2016-10-20 LAB — CYTOLOGY - PAP

## 2016-11-20 ENCOUNTER — Emergency Department (HOSPITAL_BASED_OUTPATIENT_CLINIC_OR_DEPARTMENT_OTHER)
Admission: EM | Admit: 2016-11-20 | Discharge: 2016-11-20 | Disposition: A | Discharge status: Home / Self Care | Payer: BLUE CROSS/BLUE SHIELD | Attending: Emergency Medicine | Admitting: Emergency Medicine

## 2016-11-20 ENCOUNTER — Encounter (HOSPITAL_BASED_OUTPATIENT_CLINIC_OR_DEPARTMENT_OTHER): Payer: Self-pay | Admitting: Emergency Medicine

## 2016-11-20 DIAGNOSIS — M545 Low back pain: Secondary | ICD-10-CM | POA: Diagnosis present

## 2016-11-20 DIAGNOSIS — F17210 Nicotine dependence, cigarettes, uncomplicated: Secondary | ICD-10-CM | POA: Insufficient documentation

## 2016-11-20 MED ORDER — METHOCARBAMOL 500 MG PO TABS: 500.0000 mg | ORAL_TABLET | Freq: Three times a day (TID) | ORAL | 0 refills | Status: DC | PRN

## 2016-11-20 MED ORDER — METHYLPREDNISOLONE 4 MG PO TBPK: ORAL_TABLET | ORAL | 0 refills | Status: DC

## 2016-11-20 MED ORDER — OXYCODONE-ACETAMINOPHEN 5-325 MG PO TABS: 2.0000 | ORAL_TABLET | ORAL | 0 refills | Status: DC | PRN

## 2016-11-20 MED ORDER — OXYCODONE-ACETAMINOPHEN 5-325 MG PO TABS
2.0000 | ORAL_TABLET | Freq: Once | ORAL | Status: AC
  Administered 2016-11-20: 2 via ORAL
  Filled 2016-11-20: qty 2

## 2016-11-20 MED ORDER — PREDNISONE 10 MG PO TABS
60.0000 mg | ORAL_TABLET | Freq: Once | ORAL | Status: AC
  Administered 2016-11-20: 60 mg via ORAL
  Filled 2016-11-20: qty 1

## 2016-11-20 MED ORDER — CYCLOBENZAPRINE HCL 10 MG PO TABS: 10.0000 mg | ORAL_TABLET | Freq: Once | ORAL | Status: DC

## 2016-11-20 MED ORDER — METHOCARBAMOL 500 MG PO TABS
1000.0000 mg | ORAL_TABLET | Freq: Once | ORAL | Status: AC
  Administered 2016-11-20: 1000 mg via ORAL
  Filled 2016-11-20: qty 2

## 2016-11-20 NOTE — Discharge Instructions (Signed)
Keep your appointment Dr. Dutch QuintPoole as scheduled.  If you develop sudden loss of strength or feeling in your leg or legs, or if you have difficulty with emptying or controlling her bowel or bladder, recheck immediately at nearest emergency room.  Rest, on your back, hips and legs bent with support under your legs.

## 2016-11-20 NOTE — ED Triage Notes (Signed)
Patient states that she is having pain to her back, reports that she can no longer stand up straight

## 2016-11-20 NOTE — ED Provider Notes (Signed)
[facility]   CSN: [id number] Arrival date & time: 11/20/16  1302     History   Chief Complaint Chief Complaint  Patient presents with  . Back Pain    HPI Jill Cardenas is a 30 y.o. female. Patient presents evaluation of back pain. She has a history of previous L5-S1 disc herniation and underwent laminectomy and surgical repair with Dr. Dutch Quint in 2015.  She did well. She was pain-free postoperatively. She had recurrence her symptoms about 6 or 8 weeks ago. Has midline low back pain rating to her buttox bilaterally. No numbness or weakness to the legs. No footdrop. Normal bowel bladder habits. Had bowel movement and has urinated today. No incontinence.  HPI  Past Medical History:  Diagnosis Date  . Cholecystitis   . Depression   . Kidney stones   . Sciatica     Patient Active Problem List   Diagnosis Date Noted  . MDD (major depressive disorder), recurrent episode, severe (HCC) 08/31/2016  . HNP (herniated nucleus pulposus), lumbar 11/05/2014  . 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 ANKLE ARTHROSCOPY W/ OPEN REPAIR Right   . cesarian    . LUMBAR LAMINECTOMY/DECOMPRESSION MICRODISCECTOMY Left 11/05/2014   Procedure: LUMBAR LAMINECTOMY/DECOMPRESSION MICRODISCECTOMY 1 LEVEL LUMBAR 4-5;  Surgeon: Temple Pacini, MD;  Location: [facility] OR;  Service: Neurosurgery;  Laterality: Left;  LUMBAR LAMINECTOMY/DECOMPRESSION MICRODISCECTOMY 1 LEVEL LUMBAR 4-5  . TUBAL LIGATION      OB History    No data available       Home Medications    Prior to Admission medications   Medication Sig Start Date End Date Taking? Authorizing Provider  busPIRone (BUSPAR) 15 MG tablet Take 15 mg by mouth 3 (three) times daily.    Historical Provider, MD  DULoxetine 40 MG CPEP Take 40 mg by mouth daily. 09/02/16   Adonis Brook, NP  gabapentin (NEURONTIN) 100 MG capsule Take 100 mg by mouth at bedtime.     Historical Provider, MD  hydrOXYzine (ATARAX/VISTARIL) 25 MG tablet Take 1 tablet (25 mg total) by mouth every 6 (six) hours as needed for anxiety. 09/02/16   Adonis Brook, NP  methocarbamol (ROBAXIN) 500 MG tablet Take 1 tablet (500 mg total) by mouth 3 (three) times daily between meals as needed. 11/20/16   Rolland Porter, MD  methylPREDNISolone (MEDROL DOSEPAK) 4 MG TBPK tablet 6 po on day 1, decrease by 1 tab per day 11/20/16   Rolland Porter, MD  naproxen (NAPROSYN) 500 MG tablet Take 1 tablet (500 mg total) by mouth 2 (two) times daily. 10/17/16   Cheri Fowler, PA-C  oxyCODONE-acetaminophen (PERCOCET/ROXICET) 5-325 MG tablet Take 2 tablets by mouth every 4 (four) hours as needed. 11/20/16   Rolland Porter, MD  propranolol (INDERAL) 20 MG tablet Take 20 mg by mouth 4 (four) times daily.    Historical Provider, MD  sertraline (ZOLOFT) 100 MG tablet Take 100 mg by mouth daily.    Historical Provider, MD  traZODone (DESYREL) 50 MG tablet Take 1 tablet (50 mg total) by mouth at bedtime and may repeat dose one time if needed. 09/02/16   Adonis Brook, NP    Family History Family History  Problem Relation Age of Onset  . Emphysema Mother     Social History Social History  Substance Use Topics  . Smoking status: Current Every Day Smoker    Packs/day: 1.00    Types: Cigarettes  .  Smokeless tobacco: Never Used  . Alcohol use No     Allergies   Latex   Review of Systems Review of Systems  Constitutional: Negative for appetite change, chills, diaphoresis, fatigue and fever.  HENT: Negative for mouth sores, sore throat and trouble swallowing.   Eyes: Negative for visual disturbance.  Respiratory: Negative for cough, chest tightness, shortness of breath and wheezing.   Cardiovascular: Negative for chest pain.  Gastrointestinal: Negative for abdominal distention, abdominal pain, diarrhea, nausea and vomiting.  Endocrine: Negative for polydipsia, polyphagia and polyuria.  Genitourinary:  Negative for dysuria, frequency and hematuria.  Musculoskeletal: Positive for back pain. Negative for gait problem.  Skin: Negative for color change, pallor and rash.  Neurological: Negative for dizziness, syncope, light-headedness and headaches.  Hematological: Does not bruise/bleed easily.  Psychiatric/Behavioral: Negative for behavioral problems and confusion.     Physical Exam Updated Vital Signs BP 120/79 (BP Location: Right Arm)   Pulse 94   Temp 98.5 F (36.9 C) (Oral)   Resp 18   Ht 5\' 3"  (1.6 m)   Wt 150 lb (68 kg)   LMP 11/06/2016   SpO2 100%   BMI 26.57 kg/m   Physical Exam  Constitutional: She is oriented to person, place, and time. She appears well-developed and well-nourished. No distress.  HENT:  Head: Normocephalic.  Eyes: Conjunctivae are normal. Pupils are equal, round, and reactive to light. No scleral icterus.  Neck: Normal range of motion. Neck supple. No thyromegaly present.  Cardiovascular: Normal rate and regular rhythm.  Exam reveals no gallop and no friction rub.   No murmur heard. Pulmonary/Chest: Effort normal and breath sounds normal. No respiratory distress. She has no wheezes. She has no rales.  Abdominal: Soft. Bowel sounds are normal. She exhibits no distension. There is no tenderness. There is no rebound.  Musculoskeletal: Normal range of motion.  Well-healed midline lumbar scar. Minimal paraspinal tenderness.  Neurological: She is alert and oriented to person, place, and time.  Normal symmetric Strength to shoulder shrug, triceps, biceps, grip,wrist flex/extend,and intrinsics  Norma lsymmetric sensation above and below clavicles, and to all distributions to UEs. Norma symmetric strength to flex/.extend hip and knees, dorsi/plantar flex ankles. Normal symmetric sensation to all distributions to LEs Patellar and achilles reflexes 1-2+. Downgoing Babinski Antalgic, but otherwise normal gait. Not wide based.  Skin: Skin is warm and dry. No rash  noted.  Psychiatric: She has a normal mood and affect. Her behavior is normal.     ED Treatments / Results  Labs (all labs ordered are listed, but only abnormal results are displayed) Labs Reviewed - No data to display  EKG  EKG Interpretation None       Radiology No results found.  Procedures Procedures (including critical care time)  Medications Ordered in ED Medications  oxyCODONE-acetaminophen (PERCOCET/ROXICET) 5-325 MG per tablet 2 tablet (not administered)  predniSONE (DELTASONE) tablet 60 mg (not administered)  methocarbamol (ROBAXIN) tablet 1,000 mg (not administered)     Initial Impression / Assessment and Plan / ED Course  I have reviewed the triage vital signs and the nursing notes.  Pertinent labs & imaging results that were available during my care of the patient were reviewed by me and considered in my medical decision making (see chart for details).  Clinical Course     Back pain. Has an appointment with her surgeon in 9 days. No signs of acute herniation or functional neurological loss at this time via history or exam. Given ercocet Robin and  prednisone. Discharge with Percocet, Robaxin, and Medrol. I discussed and reviewed signs of acute HNP with patient. She  Final Clinical Impressions(s) / ED Diagnoses   Final diagnoses:  Acute low back pain, unspecified back pain laterality, with sciatica presence unspecified    New Prescriptions New Prescriptions   METHOCARBAMOL (ROBAXIN) 500 MG TABLET    Take 1 tablet (500 mg total) by mouth 3 (three) times daily between meals as needed.   METHYLPREDNISOLONE (MEDROL DOSEPAK) 4 MG TBPK TABLET    6 po on day 1, decrease by 1 tab per day   OXYCODONE-ACETAMINOPHEN (PERCOCET/ROXICET) 5-325 MG TABLET    Take 2 tablets by mouth every 4 (four) hours as needed.     Rolland PorterMark Iesha Summerhill, MD 11/20/16 [phone]

## 2016-11-30 ENCOUNTER — Other Ambulatory Visit: Payer: Self-pay | Admitting: Neurosurgery

## 2016-11-30 DIAGNOSIS — M5126 Other intervertebral disc displacement, lumbar region: Secondary | ICD-10-CM

## 2016-12-07 ENCOUNTER — Other Ambulatory Visit: Payer: Self-pay

## 2016-12-11 ENCOUNTER — Ambulatory Visit
Admission: RE | Admit: 2016-12-11 | Discharge: 2016-12-11 | Disposition: A | Discharge status: Home / Self Care | Payer: BLUE CROSS/BLUE SHIELD | Source: Ambulatory Visit | Attending: Neurosurgery | Admitting: Neurosurgery

## 2016-12-11 DIAGNOSIS — M5126 Other intervertebral disc displacement, lumbar region: Secondary | ICD-10-CM

## 2016-12-11 MED ORDER — GADOBENATE DIMEGLUMINE 529 MG/ML IV SOLN
14.0000 mL | Freq: Once | INTRAVENOUS | Status: AC | PRN
  Administered 2016-12-11: 14 mL via INTRAVENOUS

## 2017-05-31 NOTE — BH Assessment (Addendum)
Tele Assessment Note   Jill Cardenas is an 31 y.o. female.   Per Beatriz Stallion TTS Assessment 05/30/17: "Patient took 125 tablets of Excedrin Maigraine around 14:00 on 05/30/17. Patient did this intentionally. When asked why she did it she says ' I just don't wnt to be here anymore.' Patient saus that there have been no recent stressors in her life. Patient lives with her boyfriend and her four children. Patient had not told anyone that she had attempted. A friend had come by and could tell something was arong, so the police were called. Police called [facility] who transported patient to [facility]. Patient had a previous suicide attempt in October 2017 (overdose)."  Diagnosis: MDD, Severe  Past Medical History:  Past Medical History:  Diagnosis Date  . Cholecystitis   . Depression   . Kidney stones   . Sciatica     Past Surgical History:  Procedure Laterality Date  . ANKLE ARTHROSCOPY W/ OPEN REPAIR Right   . cesarian    . LUMBAR LAMINECTOMY/DECOMPRESSION MICRODISCECTOMY Left 11/05/2014   Procedure: LUMBAR LAMINECTOMY/DECOMPRESSION MICRODISCECTOMY 1 LEVEL LUMBAR 4-5;  Surgeon: Temple Pacini, MD;  Location: [facility];  Service: Neurosurgery;  Laterality: Left;  LUMBAR LAMINECTOMY/DECOMPRESSION MICRODISCECTOMY 1 LEVEL LUMBAR 4-5  . TUBAL LIGATION      Family History:  Family History  Problem Relation Age of Onset  . Emphysema Mother     Social History:  reports that she has been smoking Cigarettes.  She has been smoking about 1.00 pack per day. She has never used smokeless tobacco. She reports that she does not drink alcohol or use drugs.  Additional Social History:  Alcohol / Drug Use Prescriptions: SEE MAR History of alcohol / drug use?: No history of alcohol / drug abuse  CIWA:   COWS:    PATIENT STRENGTHS: (choose at least two) Average or above average intelligence Supportive family/friends  Allergies:  Allergies  Allergen Reactions  . Latex Hives     Home Medications:  (Not in a hospital admission)  OB/GYN Status:  No LMP recorded.  General Assessment Data Location of Assessment: [facility] TTS Assessment: Out of system Is this a Tele or Face-to-Face Assessment?: Tele Assessment Is this an Initial Assessment or a Re-assessment for this encounter?: Initial Assessment Marital status: Long term relationship Is patient pregnant?: No Pregnancy Status: No Living Arrangements: Spouse/significant other, Children Can pt return to current living arrangement?: Yes Admission Status: Involuntary Is patient capable of signing voluntary admission?: Yes Referral Source: Self/Family/Friend Insurance type:  Herbalist)     Crisis Care Plan Living Arrangements: Spouse/significant other, Children Name of Psychiatrist:  (NONE CURRENTLY) Name of Therapist:  (NONE CURRENTLY)  Education Status Is patient currently in school?: No Highest grade of school patient has completed:  (UNKNOWN)  Risk to self with the past 6 months Suicidal Ideation: Yes-Currently Present Has patient been a risk to self within the past 6 months prior to admission? : Other (comment) (PER PT RECORD, SUICIDE ATTEMPT IN OCT 2017 & IP ADMISSION) Suicidal Intent: Yes-Currently Present Has patient had any suicidal intent within the past 6 months prior to admission? : Yes Is patient at risk for suicide?: Yes Suicidal Plan?: Yes-Currently Present (PT ATTEMPTED SUICIDE 05/30/17 BY INGESTING ABOUT 125 EXCEDRIN) Has patient had any suicidal plan within the past 6 months prior to admission? : Yes Access to Means: Yes Specify Access to Suicidal Means:  (OTC MEDS; PER PT RECORD, NO ACCESS TO WEAPONS) What has been  your use of drugs/alcohol within the last 12 months?:  (NONE REPORTED) Previous Attempts/Gestures: Yes How many times?:  (1 REPORTED- OCT 2017) Other Self Harm Risks:  (NONE REPORTED) Triggers for Past Attempts: Unknown Intentional Self Injurious Behavior:  None Family Suicide History: Unknown Recent stressful life event(s):  (PT DENIES ANY RECENT STRESSORS) Persecutory voices/beliefs?: No Depression: Yes Depression Symptoms: Despondent, Tearfulness, Feeling worthless/self pity, Feeling angry/irritable (PLUS SELF PITY) Substance abuse history and/or treatment for substance abuse?: No Suicide prevention information given to non-admitted patients: Not applicable  Risk to Others within the past 6 months Homicidal Ideation: No (DENIES) Does patient have any lifetime risk of violence toward others beyond the six months prior to admission? : No Thoughts of Harm to Others: No Current Homicidal Intent: No Current Homicidal Plan: No Access to Homicidal Means: No Identified Victim:  (NONE) History of harm to others?: No Assessment of Violence: None Noted Violent Behavior Description:  (NA) Does patient have access to weapons?: No Criminal Charges Pending?: No Does patient have a court date: No Is patient on probation?: No  Psychosis Hallucinations: None noted (DENIES) Delusions: None noted  Mental Status Report Appearance/Hygiene: Unable to Assess Eye Contact: Poor Motor Activity: Unable to assess Speech: Unable to assess Level of Consciousness: Unable to assess Mood: Depressed, Anxious Affect: Anxious, Depressed Anxiety Level:  (UTA) Thought Processes: Coherent, Relevant Judgement: Impaired Orientation: Unable to assess Obsessive Compulsive Thoughts/Behaviors: Unable to Assess  Cognitive Functioning Concentration: Unable to Assess Memory: Recent Intact, Remote Intact IQ: Average Insight: Good Impulse Control: Unable to Assess Appetite:  (UTA) Sleep:  (UTA) Vegetative Symptoms: Unable to Assess  ADLScreening [facility]) Patient's cognitive ability adequate to safely complete daily activities?:  (UTA) Patient able to express need for assistance with ADLs?:  (UTA) Independently performs ADLs?:  (UTA)  Prior  Inpatient Therapy Prior Inpatient Therapy: Yes Prior Therapy Dates:  (OCT 2017) Prior Therapy Facilty/Provider(s):  ([facility]) Reason for Treatment:  (SUICIDE ATTEMPT)  Prior Outpatient Therapy Prior Outpatient Therapy: Yes (PAST) Prior Therapy Dates:  (FROM OCT 2017 UNTIL 2 MOS AGO) Prior Therapy Facilty/Provider(s):  ([facility]) Reason for Treatment:  (MDD) Does patient have an ACCT team?: No Does patient have Intensive In-House Services?  : No Does patient have [facility] services? : No Does patient have P4CC services?: No  ADL Screening (condition at time of admission) Patient's cognitive ability adequate to safely complete daily activities?:  (UTA) Patient able to express need for assistance with ADLs?:  (UTA) Independently performs ADLs?:  (UTA)       Abuse/Neglect Assessment (Assessment to be complete while patient is alone) Physical Abuse: Denies Verbal Abuse: Denies Sexual Abuse: Denies Exploitation of patient/patient's resources: Denies Self-Neglect: Denies     Merchant navy officerAdvance Directives (For Healthcare) Does Patient Have a Medical Advance Directive?:  (UTA)    Additional Information 1:1 In Past 12 Months?: Yes CIRT Risk: No Elopement Risk: No Does patient have medical clearance?: Yes     Disposition:  Disposition Initial Assessment Completed for this Encounter: Yes Disposition of Patient: Inpatient treatment program (PER SPENCER SIMON, PA) Type of inpatient treatment program: Adult (ACCEPTED TO [facility] ROOM 401-2 PER TINA TATE, AC)  Mileena Rothenberger T 05/31/2017 8:20 PM

## 2017-06-01 ENCOUNTER — Encounter (HOSPITAL_COMMUNITY): Payer: Self-pay | Admitting: *Deleted

## 2017-06-01 ENCOUNTER — Inpatient Hospital Stay (HOSPITAL_COMMUNITY)
Admission: AD | Admit: 2017-06-01 | Discharge: 2017-06-05 | DRG: 885 | Disposition: A | Discharge status: Home / Self Care | Payer: Federal, State, Local not specified - Other | Source: Intra-hospital | Attending: Psychiatry | Admitting: Psychiatry

## 2017-06-01 DIAGNOSIS — Z87442 Personal history of urinary calculi: Secondary | ICD-10-CM

## 2017-06-01 DIAGNOSIS — Z818 Family history of other mental and behavioral disorders: Secondary | ICD-10-CM | POA: Diagnosis not present

## 2017-06-01 DIAGNOSIS — F419 Anxiety disorder, unspecified: Secondary | ICD-10-CM | POA: Diagnosis present

## 2017-06-01 DIAGNOSIS — Z79899 Other long term (current) drug therapy: Secondary | ICD-10-CM | POA: Diagnosis not present

## 2017-06-01 DIAGNOSIS — Z915 Personal history of self-harm: Secondary | ICD-10-CM | POA: Diagnosis not present

## 2017-06-01 DIAGNOSIS — R45850 Homicidal ideations: Secondary | ICD-10-CM | POA: Diagnosis present

## 2017-06-01 DIAGNOSIS — M549 Dorsalgia, unspecified: Secondary | ICD-10-CM

## 2017-06-01 DIAGNOSIS — G4700 Insomnia, unspecified: Secondary | ICD-10-CM | POA: Diagnosis present

## 2017-06-01 DIAGNOSIS — F17210 Nicotine dependence, cigarettes, uncomplicated: Secondary | ICD-10-CM | POA: Diagnosis not present

## 2017-06-01 DIAGNOSIS — Z91040 Latex allergy status: Secondary | ICD-10-CM | POA: Diagnosis not present

## 2017-06-01 DIAGNOSIS — F332 Major depressive disorder, recurrent severe without psychotic features: Principal | ICD-10-CM | POA: Diagnosis present

## 2017-06-01 LAB — BASIC METABOLIC PANEL
Anion gap: 8 (ref 5–15)
BUN: 8 mg/dL (ref 6–20)
CALCIUM: 9.3 mg/dL (ref 8.9–10.3)
CO2: 24 mmol/L (ref 22–32)
CREATININE: 0.63 mg/dL (ref 0.44–1.00)
Chloride: 108 mmol/L (ref 101–111)
GFR calc Af Amer: >60 mL/min (ref >60)
GLUCOSE: 88 mg/dL (ref 65–99)
Potassium: 3.6 mmol/L (ref 3.5–5.1)
Sodium: 140 mmol/L (ref 135–145)

## 2017-06-01 LAB — CBC
HCT: 41.4 % (ref 36.0–46.0)
Hemoglobin: 14.3 g/dL (ref 12.0–15.0)
MCH: 30 pg (ref 26.0–34.0)
MCHC: 34.5 g/dL (ref 30.0–36.0)
MCV: 87 fL (ref 78.0–100.0)
PLATELETS: 347 10*3/uL (ref 150–400)
RBC: 4.76 MIL/uL (ref 3.87–5.11)
RDW: 12.4 % (ref 11.5–15.5)
WBC: 11.3 10*3/uL — ABNORMAL HIGH (ref 4.0–10.5)

## 2017-06-01 LAB — MAGNESIUM: Magnesium: 2.1 mg/dL (ref 1.7–2.4)

## 2017-06-01 LAB — SALICYLATE LEVEL

## 2017-06-01 MED ORDER — TRAZODONE HCL 50 MG PO TABS
50.0000 mg | ORAL_TABLET | Freq: Every evening | ORAL | Status: DC | PRN
  Filled 2017-06-01: qty 7

## 2017-06-01 MED ORDER — LAMOTRIGINE 25 MG PO TABS
25.0000 mg | ORAL_TABLET | Freq: Every day | ORAL | Status: DC
  Administered 2017-06-01 – 2017-06-05 (×5): 25 mg via ORAL
  Filled 2017-06-01: qty 7
  Filled 2017-06-01 (×4): qty 1
  Filled 2017-06-01: qty 7
  Filled 2017-06-01 (×3): qty 1

## 2017-06-01 MED ORDER — HYDROXYZINE HCL 25 MG PO TABS
25.0000 mg | ORAL_TABLET | Freq: Four times a day (QID) | ORAL | Status: DC | PRN
Start: 2017-06-01 — End: 2017-06-05
  Administered 2017-06-01 – 2017-06-04 (×4): 25 mg via ORAL
  Filled 2017-06-01: qty 1
  Filled 2017-06-01: qty 10
  Filled 2017-06-01 (×3): qty 1

## 2017-06-01 MED ORDER — ACETAMINOPHEN 325 MG PO TABS
650.0000 mg | ORAL_TABLET | Freq: Four times a day (QID) | ORAL | Status: DC | PRN
  Administered 2017-06-01 – 2017-06-03 (×4): 650 mg via ORAL
  Filled 2017-06-01 (×4): qty 2

## 2017-06-01 MED ORDER — DULOXETINE HCL 20 MG PO CPEP
40.0000 mg | ORAL_CAPSULE | Freq: Every day | ORAL | Status: DC
  Filled 2017-06-01 (×2): qty 2

## 2017-06-01 MED ORDER — ALUM & MAG HYDROXIDE-SIMETH 200-200-20 MG/5ML PO SUSP: 30.0000 mL | ORAL | Status: DC | PRN

## 2017-06-01 MED ORDER — MAGNESIUM HYDROXIDE 400 MG/5ML PO SUSP: 30.0000 mL | Freq: Every day | ORAL | Status: DC | PRN

## 2017-06-01 MED ORDER — SERTRALINE HCL 50 MG PO TABS
50.0000 mg | ORAL_TABLET | Freq: Every day | ORAL | Status: DC
  Administered 2017-06-01 – 2017-06-02 (×2): 50 mg via ORAL
  Filled 2017-06-01 (×4): qty 1

## 2017-06-01 MED ORDER — GABAPENTIN 100 MG PO CAPS
100.0000 mg | ORAL_CAPSULE | Freq: Two times a day (BID) | ORAL | Status: DC
  Administered 2017-06-01 – 2017-06-04 (×8): 100 mg via ORAL
  Filled 2017-06-01 (×12): qty 1

## 2017-06-01 MED ORDER — NICOTINE 21 MG/24HR TD PT24
21.0000 mg | MEDICATED_PATCH | Freq: Every day | TRANSDERMAL | Status: DC
  Administered 2017-06-01 – 2017-06-04 (×4): 21 mg via TRANSDERMAL
  Filled 2017-06-01 (×7): qty 1

## 2017-06-01 NOTE — Progress Notes (Signed)
Pt is a 31 year old female admitted involuntarily to [facility] after she attempted suicide by overdosing on 125 Excedrin migraine pills.  Pt denies stressors during admission.  She does report she lost her insurance "2 months ago" and has not been taking her medications since.  She reports she has tried multiple medications in the past with no effect.  She reports she has previously tried Zoloft and Wellbutrin as antidepressants with no effect.  Pt was at [facility] in 08/2016 for overdose on Valium in suicide attempt.  Pt denies medical problems, denies history of abuse.  Presents with depressed affect and mood.  Denies SI/HI, hallucinations, and pain during admit assessment.    Introduced self to pt.  Admission process and paperwork completed with pt.  Non-invasive body assessment completed by female pt, see flowsheet.  Belongings searched for contraband.  Pt oriented to unit and unit routine.  Offered meal and PO fluids, pt declined.  On-site provider contacted for additional orders.  Fall prevention techniques reviewed with pt, pt verbalized understanding.  Pt placed on Q15 minute safety checks.   Pt is calm and cooperative with admission process.  She verbally contracts for safety and reports she will inform staff of needs and concerns.  Pt is safe on the unit.  Report given to attending RN.

## 2017-06-01 NOTE — [facility] Suicide Risk Assessment (Signed)
[facility] INPATIENT:  Family/Significant Other Suicide Prevention Education  Suicide Prevention Education:  Education Completed; Jill MonteHeather Cardenas, Pt's significant other [phone], has been identified by the patient as the family member/significant other with whom the patient will be residing, and identified as the person(s) who will aid the patient in the event of a mental health crisis (suicidal ideations/suicide attempt).  With written consent from the patient, the family member/significant other has been provided the following suicide prevention education, prior to the and/or following the discharge of the patient.  The suicide prevention education provided includes the following:  Suicide risk factors  Suicide prevention and interventions  National Suicide Hotline telephone number  [facility] assessment telephone number  [facility] Emergency Assistance 911  [facility] and/or Residential Mobile Crisis Unit telephone number  Request made of family/significant other to:  Remove weapons (e.g., guns, rifles, knives), all items previously/currently identified as safety concern.    Remove drugs/medications (over-the-counter, prescriptions, illicit drugs), all items previously/currently identified as a safety concern.  The family member/significant other verbalizes understanding of the suicide prevention education information provided.  The family member/significant other agrees to remove the items of safety concern listed above.  Pt's SO believes that Pt losing her job/insurance along with her professional support. Per SO, Pt decided to stop taking her medication recently (approximately 3wks ago).   Jill LennertLauren C Morgan Cardenas 06/01/2017, 9:49 AM

## 2017-06-01 NOTE — Tx Team (Signed)
Interdisciplinary Treatment and Diagnostic Plan Update  06/01/2017 Time of Session: 9:30am Jill Cardenas MRN: [id number]  Principal Diagnosis: Major Depression, Severe, No Psychotic Features   Secondary Diagnoses: Active Problems:   MDD (major depressive disorder), recurrent severe, without psychosis ([place])   Current Medications:  Current Facility-Administered Medications  Medication Dose Route Frequency Provider Last Rate Last Dose  . acetaminophen (TYLENOL) tablet 650 mg  650 mg Oral Q6H PRN Ethelene Hal, NP      . alum & mag hydroxide-simeth (MAALOX/MYLANTA) 200-200-20 MG/5ML suspension 30 mL  30 mL Oral Q4H PRN Ethelene Hal, NP      . gabapentin (NEURONTIN) capsule 100 mg  100 mg Oral BID Cobos, Myer Peer, MD   100 mg at 06/01/17 0912  . hydrOXYzine (ATARAX/VISTARIL) tablet 25 mg  25 mg Oral Q6H PRN Ethelene Hal, NP      . lamoTRIgine (LAMICTAL) tablet 25 mg  25 mg Oral Daily Cobos, Myer Peer, MD   25 mg at 06/01/17 0912  . magnesium hydroxide (MILK OF MAGNESIA) suspension 30 mL  30 mL Oral Daily PRN Ethelene Hal, NP      . nicotine (NICODERM CQ - dosed in mg/24 hours) patch 21 mg  21 mg Transdermal Daily Cobos, Myer Peer, MD   21 mg at 06/01/17 0913  . sertraline (ZOLOFT) tablet 50 mg  50 mg Oral Daily Cobos, Myer Peer, MD   50 mg at 06/01/17 0913  . traZODone (DESYREL) tablet 50 mg  50 mg Oral QHS PRN Ethelene Hal, NP        PTA Medications: Prescriptions Prior to Admission  Medication Sig Dispense Refill Last Dose  . Brexpiprazole (REXULTI) 3 MG TABS Take by mouth.   Not Taking at Unknown time  . busPIRone (BUSPAR) 15 MG tablet Take 15 mg by mouth 3 (three) times daily.   Not Taking at Unknown time  . gabapentin (NEURONTIN) 100 MG capsule Take 100 mg by mouth at bedtime.   Not Taking at Unknown time  . hydrOXYzine (ATARAX/VISTARIL) 25 MG tablet Take 1 tablet (25 mg total) by mouth every 6 (six) hours as needed for anxiety. 30  tablet 0   . lamoTRIgine (LAMICTAL) 100 MG tablet Take 300 mg by mouth daily.   Not Taking at Unknown time  . propranolol ER (INDERAL LA) 60 MG 24 hr capsule Take 60 mg by mouth daily.   Not Taking at Unknown time  . sertraline (ZOLOFT) 100 MG tablet Take 150 mg by mouth daily.    Not Taking at Unknown time    Treatment Modalities: Medication Management, Group therapy, Case management,  1 to 1 session with clinician, Psychoeducation, Recreational therapy.  Patient Stressors: Financial difficulties Other: "I'm not sure," pt recently lost her insurance  Patient Strengths: Average or above average intelligence Capable of independent living Curator fund of knowledge Physical Health  Physician Treatment Plan for Primary Diagnosis: Major Depression, Severe, No Psychotic Features  Long Term Goal(s): Improvement in symptoms so as ready for discharge  Short Term Goals: Ability to maintain clinical measurements within normal limits will improve Compliance with prescribed medications will improve Ability to identify changes in lifestyle to reduce recurrence of condition will improve Ability to verbalize feelings will improve Ability to disclose and discuss suicidal ideas Ability to demonstrate self-control will improve Ability to identify and develop effective coping behaviors will improve  Medication Management: Evaluate patient's response, side effects, and tolerance of medication regimen.  Therapeutic Interventions:  1 to 1 sessions, Unit Group sessions and Medication administration.  Evaluation of Outcomes: Not Met  Physician Treatment Plan for Secondary Diagnosis: Active Problems:   MDD (major depressive disorder), recurrent severe, without psychosis ([place])   Long Term Goal(s): Improvement in symptoms so as ready for discharge  Short Term Goals: Ability to maintain clinical measurements within normal limits will improve Compliance with prescribed medications  will improve Ability to identify changes in lifestyle to reduce recurrence of condition will improve Ability to verbalize feelings will improve Ability to disclose and discuss suicidal ideas Ability to demonstrate self-control will improve Ability to identify and develop effective coping behaviors will improve  Medication Management: Evaluate patient's response, side effects, and tolerance of medication regimen.  Therapeutic Interventions: 1 to 1 sessions, Unit Group sessions and Medication administration.  Evaluation of Outcomes: Not Met   RN Treatment Plan for Primary Diagnosis: Major Depression, Severe, No Psychotic Features  Long Term Goal(s): Knowledge of disease and therapeutic regimen to maintain health will improve  Short Term Goals: Ability to verbalize feelings will improve, Ability to disclose and discuss suicidal ideas and Ability to identify and develop effective coping behaviors will improve  Medication Management: RN will administer medications as ordered by provider, will assess and evaluate patient's response and provide education to patient for prescribed medication. RN will report any adverse and/or side effects to prescribing provider.  Therapeutic Interventions: 1 on 1 counseling sessions, Psychoeducation, Medication administration, Evaluate responses to treatment, Monitor vital signs and CBGs as ordered, Perform/monitor CIWA, COWS, AIMS and Fall Risk screenings as ordered, Perform wound care treatments as ordered.  Evaluation of Outcomes: Not Met   LCSW Treatment Plan for Primary Diagnosis: Major Depression, Severe, No Psychotic Features  Long Term Goal(s): Safe transition to appropriate next level of care at discharge, Engage patient in therapeutic group addressing interpersonal concerns.  Short Term Goals: Engage patient in aftercare planning with referrals and resources, Identify triggers associated with mental health/substance abuse issues and Increase skills  for wellness and recovery  Therapeutic Interventions: Assess for all discharge needs, 1 to 1 time with Social worker, Explore available resources and support systems, Assess for adequacy in community support network, Educate family and significant other(s) on suicide prevention, Complete Psychosocial Assessment, Interpersonal group therapy.  Evaluation of Outcomes: Not Met   Progress in Treatment: Attending groups: Pt is new to milieu, continuing to assess  Participating in groups: Pt is new to milieu, continuing to assess  Taking medication as prescribed: Yes, MD continues to assess for medication changes as needed Toleration medication: Yes, no side effects reported at this time Family/Significant other contact made: Yes, with significant other Patient understands diagnosis: Continuing to assess Discussing patient identified problems/goals with staff: Yes Medical problems stabilized or resolved: Yes Denies suicidal/homicidal ideation: No, Pt recently admitted after overdose Issues/concerns per patient self-inventory: None Other: N/A  New problem(s) identified: None identified at this time.   New Short Term/Long Term Goal(s): None identified at this time.   Discharge Plan or Barriers: Pt will return home and follow-up with outpatient services at [facility]  Reason for Continuation of Hospitalization: Anxiety Depression Medication stabilization Suicidal ideation  Estimated Length of Stay: 3-5 days; est DC date of 7/17  Attendees: Patient: 06/01/2017  9:52 AM  Physician: Dr. Parke Poisson 06/01/2017  9:52 AM  Nursing: Felicity Pellegrini; RN 06/01/2017  9:52 AM  RN Care Manager: 06/01/2017  9:52 AM  Social Worker: Adriana Reams, LCSW 06/01/2017  9:52 AM  Recreational Therapist:  06/01/2017  9:52 AM  Other: Lindell Spar, NP; Mordecai Maes, NP 06/01/2017  9:52 AM  Other:  06/01/2017  9:52 AM  Other: 06/01/2017  9:52 AM    Scribe for Treatment Team: Gladstone Lighter,  LCSW 06/01/2017 9:52 AM
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
## 2017-06-01 NOTE — Tx Team (Signed)
Initial Treatment Plan 06/01/2017 3:06 AM Jill Cardenas JXB:[id number]    PATIENT STRESSORS: Financial difficulties Other: "I'm not sure," pt recently lost her insurance   PATIENT STRENGTHS: Average or above average intelligence Capable of independent living Communication skills General fund of knowledge Physical Health   PATIENT IDENTIFIED PROBLEMS: SI  depression    "come out of my fog"  "have some form of a treatment plan"              DISCHARGE CRITERIA:  Improved stabilization in mood, thinking, and/or behavior Need for constant or close observation no longer present Reduction of life-threatening or endangering symptoms to within safe limits  PRELIMINARY DISCHARGE PLAN: Outpatient therapy Return to previous living arrangement  PATIENT/FAMILY INVOLVEMENT: This treatment plan has been presented to and reviewed with the patient, Jill MinaHeather R Cardenas, and/or family member.  The patient and family have been given the opportunity to ask questions and make suggestions.  Arrie AranChurch, Abriana Saltos J, [place]RN 06/01/2017, 3:06 AM
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
## 2017-06-01 NOTE — Progress Notes (Signed)
D: Pt presents with an animated affect and anxious mood. Pt rates depression 2/10. Anxiety 8/10. Pt denies SI. Pt stated, "I'm here because I tried to kill myself again". When asked if pt tried using any coping skills prior to admit. Pt stated, "I tried but it didn't work". Pt then stated that her therapist is supportive but she didn't contact her therapist at the time. Pt goal is to work on developing new coping skills. A: Medications reviewed with pt. Medications administered as ordered per MD. Verbal support provided. Pt encouraged to attend groups. 15 minute checks performed for safety. R: Pt compliant with tx.

## 2017-06-01 NOTE — [facility] Group Notes (Signed)
[facility] LCSW Group Therapy 06/01/2017 1:15pm  Type of Therapy: Group Therapy- Balance in Life  Participation Level: Active   Description of the Group:  The topic for group was balance in life. Today's group focused on defining balance in one's own words, identifying things that can knock one off balance, and exploring healthy ways to maintain balance in life. Group members were asked to provide an example of a time when they felt off balance, describe how they handled that situation,and process healthier ways to regain balance in the future. Group members were asked to share the most important tool for maintaining [facility] and how they plan to apply this method after discharge.  Summary of Patient Progress Pt identified feeling that her life is unbalanced. She was able to process feeling overwhelmed, helpless, and out of control as markers that she was feeling unbalanced. Pt reports that she wold like to work on reaching out to people to express how she is feeling prior to falling into a crisis.     Therapeutic Modalities:   Cognitive Behavioral Therapy Solution-Focused Therapy Assertiveness Training   Vernie ShanksLauren Dream Harman, LCSW 06/01/2017 2:18 PM

## 2017-06-01 NOTE — [facility] Suicide Risk Assessment (Signed)
[facility]   Nursing information obtained from:   patient and chart Demographic factors:    31 year old female, lives with boyfriend, unemployed, has four children currently with parents  Current Mental Status:   see below Loss Factors:    medication non compliance  Historical Factors:   depression,history of suicide attempts, one prior psychiatric admission Risk Reduction Factors:   resilience, social support, sense of responsibility to family   Total Time spent with patient: 45 minutes Principal Problem:  MDD Diagnosis:   Patient Active Problem List   Diagnosis Date Noted  . MDD (major depressive disorder), recurrent severe, without psychosis (HCC) [F33.2] 06/01/2017  . MDD (major depressive disorder), recurrent episode, severe (HCC) [F33.2] 08/31/2016  . HNP (herniated nucleus pulposus), lumbar [M51.26] 11/05/2014  . 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  World Science writerHealth Organization [facility](WHO). Score between 0-7:  no or low risk or alcohol related problems. Score between 8-15:  moderate risk of alcohol related problems. Score between 16-19:  high risk of alcohol related problems. Score 20 or above:  warrants further diagnostic evaluation for alcohol dependence and treatment.   CLINICAL FACTORS:  31 year old female, known to our unit from prior admission 9 months ago for depression, SI. Presents for worsening depression, suicidal attempt by overdosing on Excedrin. Stopped psychiatric medications several weeks ago.  Psychiatric Specialty Exam: Physical Exam  ROS  Blood pressure 111/68, pulse 99, temperature 97.9 F (36.6 C), temperature source Oral, resp. rate 18, height 5' 2.5" (1.588 m), weight 73.9 kg (163 lb), last menstrual period  05/18/2017.Body mass index is 29.34 kg/m.   see admit note MSE    COGNITIVE FEATURES THAT CONTRIBUTE TO RISK:  Closed-mindedness and Loss of executive function    SUICIDE RISK:   Moderate:  Frequent suicidal ideation with limited intensity, and duration, some specificity in terms of plans, no associated intent, good self-control, limited dysphoria/symptomatology, some risk factors present, and identifiable protective factors, including available and accessible social support.  PLAN OF CARE: Patient will be admitted to inpatient psychiatric unit for stabilization and safety. Will provide and encourage milieu participation. Provide medication management and maked adjustments as needed.  Will follow daily.    I certify that inpatient services furnished can reasonably be expected to improve the patient's condition.   Craige CottaFernando A Liannah Yarbough, MD 06/01/2017, 9:48 AM

## 2017-06-02 MED ORDER — SERTRALINE HCL 100 MG PO TABS
100.0000 mg | ORAL_TABLET | Freq: Every day | ORAL | Status: DC
  Administered 2017-06-03 – 2017-06-05 (×3): 100 mg via ORAL
  Filled 2017-06-02 (×2): qty 1
  Filled 2017-06-02 (×2): qty 7
  Filled 2017-06-02 (×2): qty 1

## 2017-06-02 MED ORDER — ARIPIPRAZOLE 2 MG PO TABS
2.0000 mg | ORAL_TABLET | Freq: Every day | ORAL | Status: DC
  Administered 2017-06-02 – 2017-06-03 (×2): 2 mg via ORAL
  Filled 2017-06-02 (×5): qty 1

## 2017-06-02 NOTE — Progress Notes (Signed)
D: Patient's self inventory sheet: patient has fair sleep, received sleep medication.good  Appetite, normal energy level, good concentration. Rated depression 2/10, hopeless 2/10, anxiety 4/10. SI/HI/AVH: denies all. Physical complaints are denied. Goal is "focus on things i'm grateful for". Plans to work on "pay less attention to negative thoughts".   A: Medications administered, assessed medication knowledge and education given on medication regimen.  Emotional support and encouragement given patient. R: Denies SI and HI , contracts for safety. Safety maintained with 15 minute checks.

## 2017-06-02 NOTE — Progress Notes (Signed)
D: Pt at the time of assessment was flat and withdrawn to self even while in the dayroom. Pt at the time denied pain, anxiety, depression, SI, HI or AVH; states, "I am doing much better; I am ready to go home" Pt remained calm and cooperative. A: Medications offered as prescribed. All patient's questions and concerns addressed. Support, encouragement, and safe environment provided. 15-minute safety checks continue. R: Pt was med compliant. Pt attended karaoke group. Safety checks continue.

## 2017-06-02 NOTE — Progress Notes (Signed)
[facility] MD Progress Note  06/02/2017 1:39 PM Jill Cardenas  MRN:  [id number] Subjective:  Patient states she is feeling partially better than on admission. States " I think I am a little better", and has noted an improving sense of energy and less severe anhedonia. Denies suicidal ideations at this time. Denies medication side effects. Objective : I have discussed case with treatment team and have met with patient. Staff reports that patient has endorsed improvement but has continued to present with a flat affect and has tended to isolate . Patient presents with an improving mood and a less constricted affect, smiles at times during session.  She denies medication side effects. As she improves she is more future oriented, and states " I hope I can get discharged soon". Denies any suicidal ideations at present. Principal Problem: MDD  Diagnosis:   Patient Active Problem List   Diagnosis Date Noted  . MDD (major depressive disorder), recurrent severe, without psychosis ([place]) [F33.2] 06/01/2017  . MDD (major depressive disorder), recurrent episode, severe ([place]) [F33.2] 08/31/2016  . HNP (herniated nucleus pulposus), lumbar [M51.26] 11/05/2014  . 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 Cholecystitis   . Depression   . Kidney stones   . Sciatica     Past Surgical History:  Procedure Laterality Date  . ANKLE ARTHROSCOPY W/ OPEN REPAIR Right   . cesarian    . LUMBAR LAMINECTOMY/DECOMPRESSION MICRODISCECTOMY Left 11/05/2014   Procedure: LUMBAR LAMINECTOMY/DECOMPRESSION MICRODISCECTOMY 1 LEVEL LUMBAR 4-5;  Surgeon: Charlie Pitter, MD;  Location: [facility];  Service: Neurosurgery;  Laterality: Left;  LUMBAR LAMINECTOMY/DECOMPRESSION MICRODISCECTOMY 1 LEVEL LUMBAR 4-5  . TUBAL LIGATION     Family History:  Family History  Problem Relation Age of Onset  . Emphysema  Mother    Social History:  History  Alcohol Use No     History  Drug Use No    Social History   Social History  . Marital status: Single    Spouse name: N/A  . Number of children: N/A  . Years of education: N/A   Social History Main Topics  . Smoking status: Current Every Day Smoker    Packs/day: 1.00    Types: Cigarettes  . Smokeless tobacco: Never Used  . Alcohol use No  . Drug use: No  . Sexual activity: Yes   Other Topics Concern  . None   Social History Narrative  . None   Additional Social History:    Pain Medications: denies Prescriptions: denies Over the Counter: overdosed on Excedrin Migraine History of alcohol / drug use?: No history of alcohol / drug abuse Longest period of sobriety (when/how long): NA  Sleep: improved   Appetite:  improved   Current Medications: Current Facility-Administered Medications  Medication Dose Route Frequency Provider Last Rate Last Dose  . acetaminophen (TYLENOL) tablet 650 mg  650 mg Oral Q6H PRN Ethelene Hal, NP   650 mg at 06/02/17 1156  . alum & mag hydroxide-simeth (MAALOX/MYLANTA) 200-200-20 MG/5ML suspension 30 mL  30 mL Oral Q4H PRN Ethelene Hal, NP      . ARIPiprazole (ABILIFY) tablet 2 mg  2 mg Oral Daily Cobos, Myer Peer, MD   2 mg at 06/02/17 1043  . gabapentin (NEURONTIN) capsule 100 mg  100 mg Oral BID Cobos, Myer Peer, MD   100 mg at 06/02/17 0743  .  hydrOXYzine (ATARAX/VISTARIL) tablet 25 mg  25 mg Oral Q6H PRN Ethelene Hal, NP   25 mg at 06/01/17 2208  . lamoTRIgine (LAMICTAL) tablet 25 mg  25 mg Oral Daily Cobos, Myer Peer, MD   25 mg at 06/02/17 0743  . magnesium hydroxide (MILK OF MAGNESIA) suspension 30 mL  30 mL Oral Daily PRN Ethelene Hal, NP      . nicotine (NICODERM CQ - dosed in mg/24 hours) patch 21 mg  21 mg Transdermal Daily Cobos, Myer Peer, MD   21 mg at 06/02/17 0743  . [START ON 06/03/2017] sertraline (ZOLOFT) tablet 100 mg  100 mg Oral Daily Cobos,  Fernando A, MD      . traZODone (DESYREL) tablet 50 mg  50 mg Oral QHS PRN Ethelene Hal, NP        Lab Results:  Results for orders placed or performed during the hospital encounter of 06/01/17 (from the past 48 hour(s))  Salicylate level     Status: None   Collection Time: 06/01/17  6:49 AM  Result Value Ref Range   Salicylate Lvl <5.3 2.8 - 30.0 mg/dL    Comment: Performed at [facility], [place] [address], [place], [place] [address]  Basic metabolic panel     Status: None   Collection Time: 06/01/17  6:49 AM  Result Value Ref Range   Sodium 140 135 - 145 mmol/L   Potassium 3.6 3.5 - 5.1 mmol/L   Chloride 108 101 - 111 mmol/L   CO2 24 22 - 32 mmol/L   Glucose, Bld 88 65 - 99 mg/dL   BUN 8 6 - 20 mg/dL   Creatinine, Ser 0.63 0.44 - 1.00 mg/dL   Calcium 9.3 8.9 - 10.3 mg/dL   GFR calc non Af Amer >60 >60 mL/min   GFR calc Af Amer >60 >60 mL/min    Comment: (NOTE) The eGFR has been calculated using the CKD EPI equation. This calculation has not been validated in all clinical situations. eGFR's persistently <60 mL/min signify possible Chronic Kidney Disease.    Anion gap 8 5 - 15    Comment: Performed at [facility], [place] [address] [place], [place] [address]  Magnesium     Status: None   Collection Time: 06/01/17  6:49 AM  Result Value Ref Range   Magnesium 2.1 1.7 - 2.4 mg/dL    Comment: Performed at [facility], [place] [address], [place], [place] [address]  CBC     Status: Abnormal   Collection Time: 06/01/17  6:49 AM  Result Value Ref Range   WBC 11.3 (H) 4.0 - 10.5 K/uL   RBC 4.76 3.87 - 5.11 MIL/uL   Hemoglobin 14.3 12.0 - 15.0 g/dL   HCT 41.4 36.0 - 46.0 %   MCV 87.0 78.0 - 100.0 fL   MCH 30.0 26.0 - 34.0 pg   MCHC 34.5 30.0 - 36.0 g/dL   RDW 12.4 11.5 - 15.5 %   Platelets 347 150 - 400 K/uL    Comment: Performed at [facility], [place] [address], [place], [place] [address]     Blood Alcohol level:  Lab Results  Component Value Date   [facility] <5 33/29/5188    Metabolic Disorder Labs: Lab Results  Component Value Date   HGBA1C 5.0 09/01/2016   MPG 97 09/01/2016   No results found for: PROLACTIN Lab Results  Component Value Date   CHOL 203 (H) 09/01/2016  TRIG 132 09/01/2016   HDL 36 (L) 09/01/2016   CHOLHDL 5.6 09/01/2016   VLDL 26 09/01/2016   LDLCALC 141 (H) 09/01/2016    Physical Findings: AIMS: Facial and Oral Movements Muscles of Facial Expression: None, normal Lips and Perioral Area: None, normal Jaw: None, normal Tongue: None, normal,Extremity Movements Upper (arms, wrists, hands, fingers): None, normal Lower (legs, knees, ankles, toes): None, normal, Trunk Movements Neck, shoulders, hips: None, normal, Overall Severity Severity of abnormal movements (highest score from questions above): None, normal Incapacitation due to abnormal movements: None, normal Patient's awareness of abnormal movements (rate only patient's report): No Awareness, Dental Status Current problems with teeth and/or dentures?: No Does patient usually wear dentures?: No  CIWA:  CIWA-Ar Total: 0 COWS:  COWS Total Score: 1  Musculoskeletal: Strength & Muscle Tone: within normal limits Gait & Station: normal Patient leans: N/A  Psychiatric Specialty Exam: Physical Exam  ROS denies chest pain, no shortness of breath, no vomiting   Blood pressure 118/85, pulse (!) 121, temperature 97.8 F (36.6 C), temperature source Oral, resp. rate 18, height 5' 2.5" (1.588 m), weight 73.9 kg (163 lb), last menstrual period 05/18/2017.Body mass index is 29.34 kg/m.  General Appearance: improving grooming   Eye Contact:  Good  Speech:  Normal Rate  Volume:  Normal  Mood:  states feeling better, less severely depressed   Affect:  Appropriate, less constricted, smiles briefly at times   Thought Process:  Linear and Descriptions of Associations: Intact  Orientation:  Other:   fully alert and attentive  Thought Content:  denies hallucinations, no delusions, not internally preoccupied   Suicidal Thoughts:  No denies suicidal or self injurious ideations, denies any homicidal or violent ideations, contracts for safety on unit at this time  Homicidal Thoughts:  No  Memory:  recent and remote grossly intact   Judgement:  Fair- improving   Insight:  Fair improving   Psychomotor Activity:  Normal  Concentration:  Concentration: Good and Attention Span: Good  Recall:  Good  Fund of Knowledge:  Good  Language:  Good  Akathisia:  No  Handed:  Right  AIMS (if indicated):     Assets:  Desire for Improvement Resilience  ADL's:  Intact  Cognition:  WNL  Sleep:  Number of Hours: 6.5   Assessment - patient is reporting improving mood and presents with a partially improved affect. Denies any active SI at this time. Behavior on unit calm and in good control. Denies medication side effects- we discussed medication options. Patient had been on Rexulti prior to this admission- this medication not on formulary at this time. Patient states that she has a sibling who has done well on Abilify, and has wanted to " give it a try". We reviewed medication side effect profile.  Treatment Plan Summary: Daily contact with patient to assess and evaluate symptoms and progress in treatment, Medication management, Plan inpatient treatment  and medications as below Encourage group and milieu participation to work on coping skills and symptom reduction Start Abilify 2 mgrs QDAY as antidepressant augmentation  Continue Lamictal 25 mgrs QDAY for mood disorder, depression, titrate gradually Increase Zoloft to 100 mgrs QDAY for depression and anxiety Continue Vistaril 25 mgrs Q 6 hours PRN for anxiety as needed  Continue Trazodone 50 mgrs QHS PRN for insomnia Treatment team working on disposition planning options Jenne Campus, MD 06/02/2017, 1:39 PM

## 2017-06-02 NOTE — Progress Notes (Signed)
Recreation Therapy Notes  Date: 06/02/2017 Time: 9:30am Location: [address] Dayroom  Group Topic: Stress Management  Goal Area(s) Addresses:  Patient will verbalize importance of using healthy stress management.  Patient will identify positive emotions associated with healthy stress management.   Intervention: Stress Management  Activity :  Meditation. Recreation Therapy Intern introduced the stress management technique of meditation. Recreation Therapy Intern read a script that allowed patients to pay attention to their breathing. Recreation Therapy Intern played zen meditation music. Patients were to follow along as script was read to engage in the activity.  Education: Stress Management, Discharge Planning.   Education Outcome: Needs additional edcuation  Clinical Observations/Feedback: Pt did not attended group.  Rachel Meyer, Recreation Therapy Intern  Timoth Schara, LRT/CTRS 

## 2017-06-02 NOTE — [facility] Group Notes (Signed)
[facility] LCSW Group Therapy 06/02/2017 1:15pm  Type of Therapy: Group Therapy- Feelings Around Relapse and Recovery  Participation Level: Active   Participation Quality:  Appropriate  Affect:  Flat  Cognitive: Alert and Oriented   Insight:  Developing   Engagement in Therapy: Developing/Improving and Engaged   Modes of Intervention: Clarification, Confrontation, Discussion, Education, Exploration, Limit-setting, Orientation, Problem-solving, Rapport Building, Dance movement psychotherapisteality Testing, Socialization and Support  Summary of Progress/Problems: The topic for today was feelings about relapse. The group discussed what relapse prevention is to them and identified triggers that they are on the path to relapse. Members also processed their feeling towards relapse and were able to relate to common experiences. Group also discussed coping skills that can be used for relapse prevention.  Pt expressed that she often isolates both physically and emotionally. Pt reports that she often does this in attempts to protect him against judgement. Pt also recognizes that this is detrimental to her mental health. She identifies that she is triggered by conflict.    Therapeutic Modalities:   Cognitive Behavioral Therapy Solution-Focused Therapy Assertiveness Training Relapse Prevention Therapy    Jill FusiLauren Haadiya Frogge, LCSW [phone] 06/02/2017 2:15 PM

## 2017-06-03 MED ORDER — ARIPIPRAZOLE 5 MG PO TABS
5.0000 mg | ORAL_TABLET | Freq: Every day | ORAL | Status: DC
  Administered 2017-06-04 – 2017-06-05 (×2): 5 mg via ORAL
  Filled 2017-06-03 (×3): qty 1
  Filled 2017-06-03 (×2): qty 7

## 2017-06-03 NOTE — [facility] Group Notes (Signed)
Adult Therapy Group Note (Clinical Social Work)  Date:  06/03/2017  Time:  10:00-11:00AM  Group Topic/Focus:  HEALTHY COPING SKILLS  Today's group focused on identifying healthy coping skills already in use by each patient, as well as healthy coping skills they would like to learn.  There was much sharing, support, psychoeducation, and encouragement provided.  Participation Level:  Active  Participation Quality:  Attentive, Sharing and Supportive  Affect:  Anxious  Cognitive:  Appropriate and Oriented  Insight: Improving  Engagement in Group:  Engaged  Modes of Intervention:  Discussion, Support and Processing  Additional Comments:  The patient shared that healthy coping already used is playing video games that take her out of her regular life, and into "fantasy land."  She stated she only uses a particularly violent game when she is feeling aggressive or angry and it helps to relieve that pressure.  She stated that she needs to learn to open up about her feelings.  She was given a practice assignment to share one feeling with each group member before the end of the day, not necessarily delving deep but just to practice.  Jill JaegerMareida J Grossman-Orr 04/22/2017, 1:28 PM

## 2017-06-03 NOTE — Progress Notes (Signed)
[facility] MD Progress Note  06/03/2017 1:52 PM Jill Cardenas  MRN:  [id number]   Subjective:  Its ok. I really started missing my children. I talked to them last night and it was bittersweet. It would have sucked if I was successful. Being here shows that I do matter and people do want me here. I have (4) boys and they like music only no sports. Attempting suiide has made me increase my self hatred for myself.   Objective : Case discussed during treatment team  and chart reviewed. During this evaluation patient remains alert and oriented x3, calm, and cooperative. Jill Cardenas continues to show improvement in treatment as good response to current medication Abilify  2 mg po daily for mood stabilization and depression management, Lamictal 25mg  po daily for mood disorder, Zoloft 100mg  po daily for depression, gabapentin 100mg  po daily for anxiety. She also has ordered Prn Trazodone and Hydroxyzine for insomnia. She reports this medication is well tolerated with adverse effects, or rashes. Jill Cardenas continues to exhibit symptoms of depression although she notes overall improvement since her admission. Sleep and eating patterns remains unchanged without difficulty. No irritability noted or reported and patient continues to engage well with both peers and staff. She continues to refute any active or passive suicidal thoughts although she continues to voice homicidal thoughts towards mothers boyfriend who lives in the home with patient, mother, and other siblings. No updates are provided at this time regarding patients discharge disposition and clinical social worker continues to work with patients care coordinator for placement.  At current, she is able to contract for safety on the unit.     Principal Problem: MDD  Diagnosis:   Patient Active Problem List   Diagnosis Date Noted  . MDD (major depressive disorder), recurrent severe, without psychosis (HCC) [F33.2] 06/01/2017  . MDD (major depressive disorder), recurrent  episode, severe (HCC) [F33.2] 08/31/2016  . HNP (herniated nucleus pulposus), lumbar [M51.26] 11/05/2014  . 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 Cholecystitis   . Depression   . Kidney stones   . Sciatica     Past Surgical History:  Procedure Laterality Date  . ANKLE ARTHROSCOPY W/ OPEN REPAIR Right   . cesarian    . LUMBAR LAMINECTOMY/DECOMPRESSION MICRODISCECTOMY Left 11/05/2014   Procedure: LUMBAR LAMINECTOMY/DECOMPRESSION MICRODISCECTOMY 1 LEVEL LUMBAR 4-5;  Surgeon: Temple Pacini, MD;  Location: [facility] OR;  Service: Neurosurgery;  Laterality: Left;  LUMBAR LAMINECTOMY/DECOMPRESSION MICRODISCECTOMY 1 LEVEL LUMBAR 4-5  . TUBAL LIGATION     Family History:  Family History  Problem Relation Age of Onset  . Emphysema Mother    Social History:  History  Alcohol Use No     History  Drug Use No    Social History   Social History  . Marital status: Single    Spouse name: N/A  . Number of children: N/A  . Years of education: N/A   Social History Main Topics  . Smoking status: Current Every Day Smoker    Packs/day: 1.00    Types: Cigarettes  . Smokeless tobacco: Never Used  . Alcohol use No  . Drug use: No  . Sexual activity: Yes   Other Topics Concern  . None   Social History Narrative  . None   Additional Social History:    Pain Medications: denies Prescriptions: denies Over the Counter: overdosed on Excedrin Migraine  History of alcohol / drug use?: No history of alcohol / drug abuse Longest period of sobriety (when/how long): NA  Sleep: improved   Appetite:  improved   Current Medications: Current Facility-Administered Medications  Medication Dose Route Frequency Provider Last Rate Last Dose  . acetaminophen (TYLENOL) tablet 650 mg  650 mg Oral Q6H PRN Laveda Abbe, NP   650 mg at 06/03/17 1119  . alum & mag  hydroxide-simeth (MAALOX/MYLANTA) 200-200-20 MG/5ML suspension 30 mL  30 mL Oral Q4H PRN Laveda Abbe, NP      . ARIPiprazole (ABILIFY) tablet 2 mg  2 mg Oral Daily Cobos, Rockey Situ, MD   2 mg at 06/03/17 1610  . gabapentin (NEURONTIN) capsule 100 mg  100 mg Oral BID Cobos, Rockey Situ, MD   100 mg at 06/03/17 9604  . hydrOXYzine (ATARAX/VISTARIL) tablet 25 mg  25 mg Oral Q6H PRN Laveda Abbe, NP   25 mg at 06/02/17 2132  . lamoTRIgine (LAMICTAL) tablet 25 mg  25 mg Oral Daily Cobos, Rockey Situ, MD   25 mg at 06/03/17 0821  . magnesium hydroxide (MILK OF MAGNESIA) suspension 30 mL  30 mL Oral Daily PRN Laveda Abbe, NP      . nicotine (NICODERM CQ - dosed in mg/24 hours) patch 21 mg  21 mg Transdermal Daily Cobos, Rockey Situ, MD   21 mg at 06/03/17 5409  . sertraline (ZOLOFT) tablet 100 mg  100 mg Oral Daily Cobos, Rockey Situ, MD   100 mg at 06/03/17 0821  . traZODone (DESYREL) tablet 50 mg  50 mg Oral QHS PRN Laveda Abbe, NP        Lab Results:  No results found for this or any previous visit (from the past 48 hour(s)).  Blood Alcohol level:  Lab Results  Component Value Date   ETH <5 08/30/2016    Metabolic Disorder Labs: Lab Results  Component Value Date   HGBA1C 5.0 09/01/2016   MPG 97 09/01/2016   No results found for: PROLACTIN Lab Results  Component Value Date   CHOL 203 (H) 09/01/2016   TRIG 132 09/01/2016   HDL 36 (L) 09/01/2016   CHOLHDL 5.6 09/01/2016   VLDL 26 09/01/2016   LDLCALC 141 (H) 09/01/2016    Physical Findings: AIMS: Facial and Oral Movements Muscles of Facial Expression: None, normal Lips and Perioral Area: None, normal Jaw: None, normal Tongue: None, normal,Extremity Movements Upper (arms, wrists, hands, fingers): None, normal Lower (legs, knees, ankles, toes): None, normal, Trunk Movements Neck, shoulders, hips: None, normal, Overall Severity Severity of abnormal movements (highest score from questions above):  None, normal Incapacitation due to abnormal movements: None, normal Patient's awareness of abnormal movements (rate only patient's report): No Awareness, Dental Status Current problems with teeth and/or dentures?: No Does patient usually wear dentures?: No  CIWA:  CIWA-Ar Total: 0 COWS:  COWS Total Score: 1  Musculoskeletal: Strength & Muscle Tone: within normal limits Gait & Station: normal Patient leans: N/A  Psychiatric Specialty Exam: Physical Exam   ROS  denies chest pain, no shortness of breath, no vomiting   Blood pressure 115/90, pulse (!) 138, temperature 98.4 F (36.9 C), temperature source Oral, resp. rate 18, height 5' 2.5" (1.588 m), weight 73.9 kg (163 lb), last menstrual period 05/18/2017.Body mass index is 29.34 kg/m.  General Appearance: improving grooming   Eye Contact:  Good  Speech:  Normal Rate  Volume:  Normal  Mood:  states feeling better, less severely  depressed   Affect:  Appropriate, less constricted, smiles briefly at times   Thought Process:  Linear and Descriptions of Associations: Intact  Orientation:  Other:  fully alert and attentive  Thought Content:  denies hallucinations, no delusions, not internally preoccupied   Suicidal Thoughts:  No denies suicidal or self injurious ideations, denies any homicidal or violent ideations, contracts for safety on unit at this time  Homicidal Thoughts:  No  Memory:  recent and remote grossly intact   Judgement:  Fair- improving   Insight:  Fair improving   Psychomotor Activity:  Normal  Concentration:  Concentration: Good and Attention Span: Good  Recall:  Good  Fund of Knowledge:  Good  Language:  Good  Akathisia:  No  Handed:  Right  AIMS (if indicated):     Assets:  Desire for Improvement Resilience  ADL's:  Intact  Cognition:  WNL  Sleep:  Number of Hours: 6.5   Assessment - patient is reporting improving mood and presents with a partially improved affect. Denies any active SI at this time.  Behavior on unit calm and in good control. Denies medication side effects- we discussed medication options. Patient had been on Rexulti prior to this admission- this medication not on formulary at this time. Patient states that she has a sibling who has done well on Abilify, and has wanted to " give it a try". We reviewed medication side effect profile.  Treatment Plan Summary: Daily contact with patient to assess and evaluate symptoms and progress in treatment, Medication management, Plan inpatient treatment  and medications as below Encourage group and milieu participation to work on coping skills and symptom reduction Increase Abilify 5mg rs QDAY as antidepressant augmentation  Continue Lamictal 25 mgrs QDAY for mood disorder, depression, titrate gradually Increase Zoloft to 100 mgrs QDAY for depression and anxiety Continue Vistaril 25 mgrs Q 6 hours PRN for anxiety as needed  Continue Trazodone 50 mgrs QHS PRN for insomnia Treatment team working on disposition planning options  Truman Haywardakia S Starkes, FNP 06/03/2017, 1:52 PM

## 2017-06-03 NOTE — Plan of Care (Signed)
Problem: Activity: Goal: Interest or engagement in activities will improve Outcome: Progressing Patient is attending groups and is engaged/appropriate.  Problem: Safety: Goal: Periods of time without injury will increase Outcome: Progressing Patient is on q15 minute safety checks and low fall risk precautions. Patient contracts for safety on the unit and remains safe at this time.

## 2017-06-03 NOTE — Progress Notes (Signed)
Pt attend wrap up group. Her day was a 4. She has improve from previous days. She's getting closer to her goal to focus being positive.

## 2017-06-03 NOTE — Progress Notes (Signed)
D: Pt at the time of assessment was flat and withdrawn to self even while in the dayroom. Pt at the time endorsed moderate depression, anxiety. Pt denied SI, HI or AVH; states, "IO thought I was ready to go; I don't feel too good today" Pt remained calm and cooperative. A: Medications offered as prescribed. All patient's questions and concerns addressed. Support, encouragement, and safe environment provided. 15-minute safety checks continue. R: Pt was med compliant. Pt attended wrap-up group. Safety checks continue.

## 2017-06-03 NOTE — Progress Notes (Signed)
Goals Wrap Up Group  Rating for Day: Increased from 4/10 to 6/10  Goal for Day: "Pay more attention to the things I am grateful for." Met/Unmet: Met Goal for tomorrow: "Continue to be more appreciative and grateful."  Patient Behavior: Engaged, appropriate, animated affect  Other: Patient reports sleeping well and denies questions or concerns for staff.  [address]  06/03/17 8:00 PM Group facilitated by Baldo Daub, RN

## 2017-06-03 NOTE — Progress Notes (Addendum)
Nursing Progress Note [id number]  D) Patient presents with animated affect and is calm and cooperative on the unit. Patient is observed interactive in the milieu and is visible in the dayroom. Patient denies SI/HI/AVH or pain. Patient contracts for safety on the unit. Patient requests vistaril for anxiety and "to help me sleep". Patient reports "my day and time here improved when I started to talk and open up more in group".  A) Emotional support given. 1:1 interaction and active listening provided. Patient medicated as prescribed. Medications and plan of care reviewed with patient. Patient verbalized understanding without further questions. Snacks and fluids provided. Opportunities for questions or concerns presented to patient. Patient encouraged to continue to work on treatment goals. Labs, vital signs and patient behavior monitored throughout shift. Patient safety maintained with q15 min safety checks. Low fall risk precautions in place and reviewed with patient; patient verbalized understanding.  R) Patient receptive to interaction with nurse. Patient remains safe on the unit at this time. Patient denies any adverse medication reactions at this time. Patient is resting in bed without complaints. Will continue to monitor.   Report given at 2300 to [facility] for shift change.

## 2017-06-04 MED ORDER — GABAPENTIN 100 MG PO CAPS
100.0000 mg | ORAL_CAPSULE | Freq: Three times a day (TID) | ORAL | Status: DC
  Administered 2017-06-05 (×2): 100 mg via ORAL
  Filled 2017-06-04 (×6): qty 21
  Filled 2017-06-04: qty 1

## 2017-06-04 NOTE — Progress Notes (Signed)
[facility] Group Notes:  (Nursing/MHT/Case Management/Adjunct)  Date:  06/04/2017  Time:  2045 Type of Therapy:  wrap up group  Participation Level:  Active  Participation Quality:  Appropriate, Attentive, Sharing and Supportive  Affect:  Appropriate  Cognitive:  Appropriate  Insight:  Improving  Engagement in Group:  Engaged  Modes of Intervention:  Clarification, Education and Support  Summary of Progress/Problems: Pt reports having a good visit with her four children today aged 998-13. Pt is looking forward to discharge and shares that if she could change one thing about her life it would be her depression. Pt is grateful for her children.   Johann CapersMcNeil, Claudio Mondry S 06/04/2017, 10:01 PM

## 2017-06-04 NOTE — Progress Notes (Signed)
Adult Psychoeducational Group Note  Date:  06/04/2017 Time:  1400  Group Topic/Focus:  Making Healthy Choices:   The focus of this group is to help patients identify negative/unhealthy supportsystems they were using prior to admission and identify positive/healthier strategies to replace them upon discharge.   Participation Level:  Active  Modes of Intervention:  Discussion and Education  Additional Comments:  Conducted by Patricia Duke, RN  Jill Cardenas 06/04/2017,1400 

## 2017-06-04 NOTE — Progress Notes (Signed)
Received report patient asleep in bed. Respirations even and non labored. No distress noted. Monitoring continues.

## 2017-06-04 NOTE — Plan of Care (Signed)
Problem: Activity: Goal: Sleeping patterns will improve Outcome: Progressing Patient is resting in bed with eyes closed; respirations even and unlabored. Patient reports sleeping well with current regimen. MHT observes patient resting during q15 minute checks.  Problem: Safety: Goal: Periods of time without injury will increase Outcome: Progressing Patient is on q15 minute safety checks and low fall risk precautions. Patient contracts for safety on the unit and remains safe at this time.   

## 2017-06-04 NOTE — Progress Notes (Signed)
Nursing Progress Note 1900-0730  D) Patient presents with animated affect and pleasant behavior. Patient reports "I am so excited, I get to go home tomorrow". Patient reports "I got much more out of my experience this time, I went to the groups and it helped to know I wasn't alone". Patient is seen interactive in the milieu. Patient denies/reports SI/HI/AVH or pain. Patient contracts for safety on the unit. Patient reports sleeping well with current regimen.  A) Emotional support given. 1:1 interaction and active listening provided. Patient medicated as prescribed. Medications and plan of care reviewed with patient. Patient verbalized understanding without further questions. Snacks and fluids provided. Opportunities for questions or concerns presented to patient. Patient encouraged to continue to work on treatment goals. Labs, vital signs and patient behavior monitored throughout shift. Patient safety maintained with q15 min safety checks. Low fall risk precautions in place and reviewed with patient; patient verbalized understanding.  R) Patient receptive to interaction with nurse. Patient remains safe on the unit at this time. Patient denies any adverse medication reactions at this time. Patient is resting in bed without complaints. Will continue to monitor.

## 2017-06-04 NOTE — [facility] Group Notes (Signed)
[facility]:  (Clinical Social Work)   06/04/2017     10:00-11:00AM  Summary of Progress/Problems:   The main focus of today's process group was to              1)  discuss importance of adding supports to stay well once out of the hospital             2)  generate ideas about what healthy supports can be added             3)  Talk about unhealthy supports, specifically those present in patients' lives             4)  Share group ideas about how to handle/reduce/eliminate unhealthy supports  The usefulness of counselor, doctor, therapy groups, 12-step groups, and problem-specific support groups to expand supports was explained.  The emphasis of the group was on generating specific ideas for eliminating or managing unhealthy supports currently present in patient lives.  At the beginning of group, when asked what she would be doing today if she was not in the hospital, the patient stated she would be isolated, quiet and alone.  She stated she did follow through on the assignment given to her yesterday to share one feeling with each group member, and said while it was difficult it also showed her that it feels good to share one's feelings.  She stated that her parents told her 6 months ago when she was first hospitalized that she was just pretending and seeking attention.  As a result, she made the decision not to have contact with them, which has been hard but also helpful.  She also stated that she has repaired the relationship with her sister who had agreed with parents, and did this by talking with her sister using "I statements."  Type of Therapy:  Process Group with Motivational Interviewing  Participation Level:  Active  Participation Quality:  Attentive and Sharing  Affect:  Anxious and Blunted  Cognitive:  Oriented  Insight:  Improving  Engagement in Therapy:  Engaged  Modes of Intervention:   Education, Support and [employer] CorporationProcessing   Jill Cardenas  Grossman-Orr, LCSW 06/04/2017, 12:54 PM

## 2017-06-04 NOTE — Progress Notes (Addendum)
[facility] MD Progress Note  06/04/2017 9:51 AM Jill Cardenas  MRN:  [id number]   Subjective:  Patient states she is feeling better. She does not endorse medication side effects. As she improves she is focusing more on disposition planning options, and is hoping to discharge soon.   Objective :  I have reviewed chart notes and have met with patient. She is presenting with improving mood and range of affect. Denies suicidal ideations . She is visible on unit, and is going to groups. Pleasant on approach. She denies medication side effects. She states that being off her psychiatric medications likely contributed to her overdose and that she is feeling significantly better now that she is back on meds.    Principal Problem: MDD  Diagnosis:   Patient Active Problem List   Diagnosis Date Noted  . MDD (major depressive disorder), recurrent severe, without psychosis ([place]) [F33.2] 06/01/2017  . MDD (major depressive disorder), recurrent episode, severe ([place]) [F33.2] 08/31/2016  . HNP (herniated nucleus pulposus), lumbar [M51.26] 11/05/2014  . 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 Cholecystitis   . Depression   . Kidney stones   . Sciatica     Past Surgical History:  Procedure Laterality Date  . ANKLE ARTHROSCOPY W/ OPEN REPAIR Right   . cesarian    . LUMBAR LAMINECTOMY/DECOMPRESSION MICRODISCECTOMY Left 11/05/2014   Procedure: LUMBAR LAMINECTOMY/DECOMPRESSION MICRODISCECTOMY 1 LEVEL LUMBAR 4-5;  Surgeon: Charlie Pitter, MD;  Location: [place];  Service: Neurosurgery;  Laterality: Left;  LUMBAR LAMINECTOMY/DECOMPRESSION MICRODISCECTOMY 1 LEVEL LUMBAR 4-5  . TUBAL LIGATION     Family History:  Family History  Problem Relation Age of Onset  . Emphysema Mother    Social History:  History  Alcohol Use No     History  Drug Use No    Social History    Social History  . Marital status: Single    Spouse name: N/A  . Number of children: N/A  . Years of education: N/A   Social History Main Topics  . Smoking status: Current Every Day Smoker    Packs/day: 1.00    Types: Cigarettes  . Smokeless tobacco: Never Used  . Alcohol use No  . Drug use: No  . Sexual activity: Yes   Other Topics Concern  . None   Social History Narrative  . None   Additional Social History:    Pain Medications: denies Prescriptions: denies Over the Counter: overdosed on Excedrin Migraine History of alcohol / drug use?: No history of alcohol / drug abuse Longest period of sobriety (when/how long): NA  Sleep: improved   Appetite:  improved   Current Medications: Current Facility-Administered Medications  Medication Dose Route Frequency Provider Last Rate Last Dose  . acetaminophen (TYLENOL) tablet 650 mg  650 mg Oral Q6H PRN Ethelene Hal, NP   650 mg at 06/03/17 1119  . alum & mag hydroxide-simeth (MAALOX/MYLANTA) 200-200-20 MG/5ML suspension 30 mL  30 mL Oral Q4H PRN Ethelene Hal, NP      . ARIPiprazole (ABILIFY) tablet 5 mg  5 mg Oral Daily Nanci Pina, FNP   5 mg at 06/04/17 0746  . gabapentin (NEURONTIN) capsule 100 mg  100 mg Oral BID Kennedy Brines, Myer Peer, MD   100 mg at 06/04/17 0746  . hydrOXYzine (ATARAX/VISTARIL) tablet 25 mg  25 mg Oral Q6H PRN Jinny Blossom  Toribio Harbour, NP   25 mg at 06/03/17 2139  . lamoTRIgine (LAMICTAL) tablet 25 mg  25 mg Oral Daily Debbora Ang, Myer Peer, MD   25 mg at 06/04/17 0746  . magnesium hydroxide (MILK OF MAGNESIA) suspension 30 mL  30 mL Oral Daily PRN Ethelene Hal, NP      . nicotine (NICODERM CQ - dosed in mg/24 hours) patch 21 mg  21 mg Transdermal Daily Kaytelynn Scripter, Myer Peer, MD   21 mg at 06/04/17 0746  . sertraline (ZOLOFT) tablet 100 mg  100 mg Oral Daily Raveena Hebdon, Myer Peer, MD   100 mg at 06/04/17 0746  . traZODone (DESYREL) tablet 50 mg  50 mg Oral QHS PRN Ethelene Hal, NP         Lab Results:  No results found for this or any previous visit (from the past 48 hour(s)).  Blood Alcohol level:  Lab Results  Component Value Date   ETH <5 01/65/5374    Metabolic Disorder Labs: Lab Results  Component Value Date   HGBA1C 5.0 09/01/2016   MPG 97 09/01/2016   No results found for: PROLACTIN Lab Results  Component Value Date   CHOL 203 (H) 09/01/2016   TRIG 132 09/01/2016   HDL 36 (L) 09/01/2016   CHOLHDL 5.6 09/01/2016   VLDL 26 09/01/2016   LDLCALC 141 (H) 09/01/2016    Physical Findings: AIMS: Facial and Oral Movements Muscles of Facial Expression: None, normal Lips and Perioral Area: None, normal Jaw: None, normal Tongue: None, normal,Extremity Movements Upper (arms, wrists, hands, fingers): None, normal Lower (legs, knees, ankles, toes): None, normal, Trunk Movements Neck, shoulders, hips: None, normal, Overall Severity Severity of abnormal movements (highest score from questions above): None, normal Incapacitation due to abnormal movements: None, normal Patient's awareness of abnormal movements (rate only patient's report): No Awareness, Dental Status Current problems with teeth and/or dentures?: No Does patient usually wear dentures?: No  CIWA:  CIWA-Ar Total: 0 COWS:  COWS Total Score: 1  Musculoskeletal: Strength & Muscle Tone: within normal limits Gait & Station: normal Patient leans: N/A  Psychiatric Specialty Exam: Physical Exam  ROS no headache, no chest pain, no shortness of breath, no vomiting, no nausea, no fever, no chills  Blood pressure 116/80, pulse (!) 121, temperature 98.9 F (37.2 C), resp. rate 18, height 5' 2.5" (1.588 m), weight 73.9 kg (163 lb), last menstrual period 05/18/2017.Body mass index is 29.34 kg/m.  General Appearance: well groomed  Eye Contact:  Good  Speech:  Normal Rate  Volume:  Normal  Mood:  improved mood, states she is feeling better   Affect:  appropriate and reactive  Thought Process:   Linear and Descriptions of Associations: Intact  Orientation:  Other:  fully alert and attentive  Thought Content:  denies hallucinations, no delusions, not internally preoccupied   Suicidal Thoughts:  No denies suicidal or self injurious ideations, denies any homicidal or violent ideations, contracts for safety on unit at this time  Homicidal Thoughts:  No  Memory:  recent and remote grossly intact   Judgement:  Fair- improving   Insight:  Fair improving   Psychomotor Activity:  Normal  Concentration:  Concentration: Good and Attention Span: Good  Recall:  Good  Fund of Knowledge:  Good  Language:  Good  Akathisia:  No  Handed:  Right  AIMS (if indicated):     Assets:  Desire for Improvement Resilience  ADL's:  Intact  Cognition:  WNL  Sleep:  Number of Hours:  6   Assessment - patient is presenting with improving mood and range of affect . At this time denies any suicidal ideations and she is hoping for discharge soon. She is tolerating medications well, has tolerated Abilify and Zoloft titrations well thus far .  Treatment Plan Summary: Treatment plan reviewed as below today 7/15  Daily contact with patient to assess and evaluate symptoms and progress in treatment, Medication management, Plan inpatient treatment  and medications as below Encourage group and milieu participation to work on coping skills and symptom reduction Continue  Abilify 5 mgrs QDAY as antidepressant augmentation  Continue Lamictal 25 mgrs QDAY for mood disorder, depression, titrate gradually Increase Neurontin to 100 mgrs TID for anxiety and pain Continue Zoloft 100 mgrs QDAY for depression and anxiety Continue Vistaril 25 mgrs Q 6 hours PRN for anxiety as needed  Continue Trazodone 50 mgrs QHS PRN for insomnia Treatment team working on disposition planning options  Jenne Campus, MD 06/04/2017, 9:51 AM   Patient ID: Jill Cardenas, female   DOB: 1986-06-11, 31 y.o.   MRN: [id number]

## 2017-06-04 NOTE — Progress Notes (Signed)
D Pt is status quo. She is seen out in the milieu...she interacts appropriately with her peers and she takes her meds as scheduled also. ASHe completed her daily assessment and on this she wrote she deneid SI today and she rated her depression, hopelessness and anxiety " 2/0/4", respectively. She shares she feels she is " getting the healp I need this time". R Safety in place.

## 2017-06-05 MED ORDER — HYDROXYZINE HCL 25 MG PO TABS
25.0000 mg | ORAL_TABLET | Freq: Four times a day (QID) | ORAL | 0 refills | Status: AC | PRN
Start: 2017-06-05 — End: ?

## 2017-06-05 MED ORDER — SERTRALINE HCL 100 MG PO TABS: 100.0000 mg | ORAL_TABLET | Freq: Every day | ORAL | 0 refills | Status: DC

## 2017-06-05 MED ORDER — LAMOTRIGINE 25 MG PO TABS: 25.0000 mg | ORAL_TABLET | Freq: Every day | ORAL | 0 refills | Status: DC

## 2017-06-05 MED ORDER — NICOTINE 21 MG/24HR TD PT24: 21.0000 mg | MEDICATED_PATCH | Freq: Every day | TRANSDERMAL | 0 refills | Status: DC

## 2017-06-05 MED ORDER — TRAZODONE HCL 50 MG PO TABS: 50.0000 mg | ORAL_TABLET | Freq: Every evening | ORAL | 0 refills | Status: DC | PRN

## 2017-06-05 MED ORDER — GABAPENTIN 100 MG PO CAPS: 100.0000 mg | ORAL_CAPSULE | Freq: Three times a day (TID) | ORAL | 0 refills | Status: DC

## 2017-06-05 MED ORDER — ARIPIPRAZOLE 5 MG PO TABS: 5.0000 mg | ORAL_TABLET | Freq: Every day | ORAL | 0 refills | Status: DC

## 2017-06-05 NOTE — Progress Notes (Signed)
Recreation Therapy Notes  Date: 06/05/2017 Time:9:30am Location: [address] Dayroom  Group Topic: Stress Management  Goal Area(s) Addresses:  Patient will verbalize importance of using healthy stress management.  Patient will identify positive emotions associated with healthy stress management.   Intervention: Stress Management  Activity: Mindful Meditation. Recreation Therapy Intern introduced the stress management technique of meditation. Recreation Therapy Intern played a mindful meditation video that focused on deep breathing and forgetting about their worries. Patients were to follow along at the video was played to engage in the activity.  Education:Stress Management, Discharge Planning.   Education Outcome:Acknowledges edcuation  Clinical Observations/Feedback:Pt did not attend group.  Fynley Chrystal, Recreation Therapy Intern 

## 2017-06-05 NOTE — Progress Notes (Signed)
Adult Psychoeducational Group Note  Date:  06/05/2017 Time:  1:02 PM  Group Topic/Focus:  Goals Group:   The focus of this group is to help patients establish daily goals to achieve during treatment and discuss how the patient can incorporate goal setting into their daily lives to aide in recovery.  Participation Level:  Active  Participation Quality:  Appropriate  Affect:  Irritable  Cognitive:  Alert  Insight: Limited  Engagement in Group:  Limited  Modes of Intervention:  Discussion  Additional Comments:  Pt was in a good mood at first, but then became irritable during a group activity because the doctor hasn't seen her yet.  Pt then walked out of group.   Jasira Robinson S Marcellino Fidalgo 06/05/2017, 1:02 PM

## 2017-06-05 NOTE — [facility] Suicide Risk Assessment (Signed)
[facility] Discharge Suicide Risk Assessment   Principal Problem: depression  Discharge Diagnoses:  Patient Active Problem List   Diagnosis Date Noted  . MDD (major depressive disorder), recurrent severe, without psychosis ([facility]) [F33.2] 06/01/2017  . MDD (major depressive disorder), recurrent episode, severe ([facility]) [F33.2] 08/31/2016  . HNP (herniated nucleus pulposus), lumbar [M51.26] 11/05/2014  . 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 rate 16, height 5' 2.5" (1.588 m), weight 73.9 kg (163 lb), last menstrual period 05/18/2017.Body mass index is 29.34 kg/m.  General Appearance: Well Groomed  Eye Contact::  Good  Speech:  Normal Rate409  Volume:  Normal  Mood:  improved mood, feels " better "  Affect:  appropriate, reactive, smiles at times appropriately  Thought Process:  Linear and Descriptions of Associations: Intact  Orientation:  Full (Time, Place, and Person)  Thought Content:  denies hallucinations, no delusions, not internally preoccupied   Suicidal Thoughts:  No denies any suicidal or self injurious ideations , denies any homicidal or violent ideations  Homicidal Thoughts:  No  Memory:  recent and remote grossly intact   Judgement:  Other:  improving   Insight:  improving   Psychomotor Activity:  Normal  Concentration:  Good  Recall:  Good  Fund of Knowledge:Good  Language: Good  Akathisia:  Negative  Handed:  Right  AIMS (if indicated):     Assets:  Communication Skills Desire for Improvement Resilience  Sleep:  Number of Hours: 6  Cognition: WNL  ADL's:  Intact   Mental Status  Per Nursing Assessment::   On Admission:     Demographic Factors:  31 year female , lives with boyfriend, has 4 children  Loss Factors: No specific stressors- had stopped taking her medications a few weeks ago  Historical Factors: One prior psychiatric admission 9 months ago for depression and suicide attempt.   Risk Reduction Factors:   Responsible for children under 55 years of age, Sense of responsibility to family, Living with another person, especially a relative, Positive social support and Positive coping skills or problem solving skills  Continued Clinical Symptoms:  At this time she is presenting much improved compared to her admission presentation. Mood is improved, affect is brighter, more reactive, no thought disorder, no suicidal ideations, no homicidal ideations, no hallucinations, no delusions,behavior is calm and in good control. She is future oriented, looking forward to reunite with her children and BF. Denies medication side effects.   Cognitive Features That Contribute To Risk:  No gross cognitive deficits noted upon discharge. Is alert , attentive, and oriented x 3   Suicide Risk:  Mild:  Suicidal ideation of limited frequency, intensity, duration, and specificity.  There are no identifiable plans, no associated intent, mild dysphoria and related symptoms, good self-control (both objective and subjective assessment), few other risk factors, and identifiable protective factors, including available and accessible social support.  Follow-up Information    [facility] Follow up on 06/13/2017.   Why:  at 1:00pm for therapy with Chase and medication management at 2:15pm with Fransisca Kaufmann on that same day.   Contact information: [address] [place] [address] [phone]  Plan Of Care/Follow-up recommendations:  Activity:  As tolerated  Diet:  Regular Tests:  NA Other:  See below  Patient is discharging in good spirits  Plans  to return home Plans to follow up as above    Craige CottaFernando A Cobos, MD 06/05/2017, 12:57 PM

## 2017-06-05 NOTE — Progress Notes (Signed)
Discharge Note:  Patient discharged home with friend.  Patient denied SI and HI.  Denied A/V hallucinations.  Suicide prevention information given and discussed with patient who stated she understood and had no questions.  Patient stated she received all her belongings, clothing, toiletries, misc items, prescriptions, medications, etc.  Patient stated she [facility].  All required discharge information given to patient at discharge.

## 2017-06-05 NOTE — Discharge Summary (Signed)
Physician Discharge Summary Note  Patient:  Jill Cardenas is an 31 y.o., female MRN:  [id number] DOB:  1986/04/15 Patient phone:  [phone] (home)  Patient address:   [address] [place] [address],  Total Time spent with patient: Greater than 30 minutes  Date of Admission:  06/01/2017  Date of Discharge: 06-05-17  (discharge summary written on behalf of Dr. Jama Flavors)  Reason for Admission:  Per H&P: "31 year old female, reports worsening depression over the last few weeks. She attempted suicide 2 days ago by overdosing on Excedrin. States she took about 120 tablets. She wrote a suicide note. After the overdose she felt physically ill, vomited several times. Boyfriend called 911.  She reports neuro-vegetative symptoms of depression as below.Patient does not identify any specific stressors or losses contributing to her worsening depression, but states she stopped her psychiatric medications 4-5 weeks ago. " I ran out". She feels medications were helping , well tolerated."  Principal Problem: MDD (major depressive disorder), recurrent severe, without psychosis [facility])    Discharge Diagnoses: Patient Active Problem List   Diagnosis Date Noted  . MDD (major depressive disorder), recurrent severe, without psychosis (HCC) [F33.2] 06/01/2017  . MDD (major depressive disorder), recurrent episode, severe (HCC) [F33.2] 08/31/2016  . HNP (herniated nucleus pulposus), lumbar [M51.26] 11/05/2014  . 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 Cholecystitis   . Depression   . Kidney stones   . Sciatica     Past Surgical History:  Procedure Laterality Date  . ANKLE ARTHROSCOPY W/ OPEN REPAIR Right   . cesarian    . LUMBAR LAMINECTOMY/DECOMPRESSION MICRODISCECTOMY Left 11/05/2014   Procedure: LUMBAR LAMINECTOMY/DECOMPRESSION MICRODISCECTOMY 1 LEVEL  LUMBAR 4-5;  Surgeon: Temple Pacini, MD;  Location: [facility] OR;  Service: Neurosurgery;  Laterality: Left;  LUMBAR LAMINECTOMY/DECOMPRESSION MICRODISCECTOMY 1 LEVEL LUMBAR 4-5  . TUBAL LIGATION     Family History:  Family History  Problem Relation Age of Onset  . Emphysema Mother    Family Psychiatric  History: See H&P  Social History:  History  Alcohol Use No     History  Drug Use No    Social History   Social History  . Marital status: Single    Spouse name: N/A  . Number of children: N/A  . Years of education: N/A   Social History Main Topics  . Smoking status: Current Every Day Smoker    Packs/day: 1.00    Types: Cigarettes  . Smokeless tobacco: Never Used  . Alcohol use No  . Drug use: No  . Sexual activity: Yes   Other Topics Concern  . None   Social History Narrative  . None   Hospital Course:  Jill Cardenas was admitted for MDD (major depressive disorder), recurrent severe, without psychosis (HCC) , and crisis management.  Pt was treated discharged with the medications listed below under Medication List.  Medical problems were identified and treated as needed.  Home medications were restarted as appropriate.  Improvement was monitored by observation and Jill Cardenas 's daily report of symptom reduction.  Emotional and mental status was monitored by daily self-inventory reports completed by Jill Cardenas and clinical staff.         Jill Cardenas was evaluated by the treatment team for stability and plans for continued recovery upon discharge. Jill Cardenas 's motivation was an integral  factor for scheduling further treatment. Employment, transportation, bed availability, health status, family support, and any pending legal issues were also considered during hospital stay. Pt was offered further treatment options upon discharge including but not limited to Residential, Intensive Outpatient, and Outpatient treatment.  Jill MinaHeather R Cardenas will follow up with the  services as listed below under Follow Up Information.     Upon completion of this admission the patient was both mentally and medically stable for discharge denying suicidal/homicidal ideation, auditory/visual/tactile hallucinations, delusional thoughts and paranoia.    Jill Cardenas responded well to treatment with abilify, neurontin, vistaril, lamictal, nicotine, zoloft, trazodone, without adverse effects. Pt demonstrated improvement without reported or observed adverse effects to the point of stability appropriate for outpatient management. Pertinent labs include: cholesterol 203, LDL 141, for which outpatient follow-up is necessary for lab recheck as mentioned below. Reviewed CBC, CMP, BAL, and UDS; all unremarkable aside from noted exceptions.   Physical Findings: AIMS: Facial and Oral Movements Muscles of Facial Expression: None, normal Lips and Perioral Area: None, normal Jaw: None, normal Tongue: None, normal,Extremity Movements Upper (arms, wrists, hands, fingers): None, normal Lower (legs, knees, ankles, toes): None, normal, Trunk Movements Neck, shoulders, hips: None, normal, Overall Severity Severity of abnormal movements (highest score from questions above): None, normal Incapacitation due to abnormal movements: None, normal Patient's awareness of abnormal movements (rate only patient's report): No Awareness, Dental Status Current problems with teeth and/or dentures?: No Does patient usually wear dentures?: No  CIWA:  CIWA-Ar Total: 0 COWS:  COWS Total Score: 1  Musculoskeletal: Strength & Muscle Tone: within normal limits Gait & Station: normal Patient leans: N/A  Psychiatric Specialty Exam: Physical Exam  Genitourinary:  Genitourinary Comments: Deferred    Review of Systems  Constitutional: Negative.   HENT: Negative.   Eyes: Negative.   Respiratory: Negative.   Cardiovascular: Negative.   Gastrointestinal: Negative.   Genitourinary: Negative.    Musculoskeletal: Negative.   Skin: Negative.   Neurological: Negative.   Endo/Heme/Allergies: Negative.   Psychiatric/Behavioral: Positive for depression (Stable) and substance abuse (Hx. Opioid use). Negative for hallucinations, memory loss and suicidal ideas. The patient has insomnia (Stable). The patient is not nervous/anxious.     Blood pressure (!) 109/92, pulse (!) 132, temperature 98.4 F (36.9 C), temperature source Oral, resp. rate 16, height 5' 2.5" (1.588 m), weight 73.9 kg (163 lb), last menstrual period 05/18/2017.Body mass index is 29.34 kg/m.  See Md's SRA   Have you used any form of tobacco in the last 30 days? (Cigarettes, Smokeless Tobacco, Cigars, and/or Pipes): Yes  Has this patient used any form of tobacco in the last 30 days? (Cigarettes, Smokeless Tobacco, Cigars, and/or Pipes): Yes, provided with a nicotine patch prescription upon discharge.  Blood Alcohol level:  Lab Results  Component Value Date   ETH <5 08/30/2016   Metabolic Disorder Labs:  Lab Results  Component Value Date   HGBA1C 5.0 09/01/2016   MPG 97 09/01/2016   No results found for: PROLACTIN Lab Results  Component Value Date   CHOL 203 (H) 09/01/2016   TRIG 132 09/01/2016   HDL 36 (L) 09/01/2016   CHOLHDL 5.6 09/01/2016   VLDL 26 09/01/2016   LDLCALC 141 (H) 09/01/2016   See Psychiatric Specialty Exam and Suicide Risk Assessment completed by Attending Physician prior to discharge.  Discharge destination:  Home  Is patient on multiple antipsychotic therapies at discharge:  No   Has Patient had three or more failed trials of antipsychotic monotherapy  by history:  No  Recommended Plan for Multiple Antipsychotic Therapies: NA  Allergies as of 06/05/2017      Reactions   Latex Hives      Medication List    STOP taking these medications   busPIRone 15 MG tablet Commonly known as:  BUSPAR   propranolol ER 60 MG 24 hr capsule Commonly known as:  INDERAL LA   REXULTI 3 MG  Tabs Generic drug:  Brexpiprazole     TAKE these medications     Indication  ARIPiprazole 5 MG tablet Commonly known as:  ABILIFY Take 1 tablet (5 mg total) by mouth daily. For mood control  Indication:  Mood control   gabapentin 100 MG capsule Commonly known as:  NEURONTIN Take 1 capsule (100 mg total) by mouth 3 (three) times daily. For agitation What changed:  when to take this  additional instructions  Indication:  Agitation   hydrOXYzine 25 MG tablet Commonly known as:  ATARAX/VISTARIL Take 1 tablet (25 mg total) by mouth every 6 (six) hours as needed for anxiety.  Indication:  Anxiety   lamoTRIgine 25 MG tablet Commonly known as:  LAMICTAL Take 1 tablet (25 mg total) by mouth daily. For mood stabilization What changed:  medication strength  how much to take  additional instructions  Indication:  Mood stabilization   nicotine 21 mg/24hr patch Commonly known as:  NICODERM CQ - dosed in mg/24 hours Place 1 patch (21 mg total) onto the skin daily. For smoking cessation  Indication:  Nicotine Addiction   sertraline 100 MG tablet Commonly known as:  ZOLOFT Take 1 tablet (100 mg total) by mouth daily. For depression What changed:  how much to take  additional instructions  Indication:  Major Depressive Disorder   traZODone 50 MG tablet Commonly known as:  DESYREL Take 1 tablet (50 mg total) by mouth at bedtime as needed for sleep.  Indication:  Trouble Sleeping      Follow-up Information    [facility] Follow up on 06/13/2017.   Why:  at 1:00pm for therapy with Chase and medication management at 2:15pm with Fransisca Kaufmann on that same day.   Contact information: [address] [place] [address] [phone]          Follow-up recommendations: Activity:  As tolerated Diet: As recommended by your primary care doctor. Keep all scheduled follow-up appointments as recommended.    Comments: Patient is instructed prior to discharge  to: Take all medications as prescribed by his/her mental healthcare provider. Report any adverse effects and or reactions from the medicines to his/her outpatient provider promptly. Patient has been instructed & cautioned: To not engage in alcohol and or illegal drug use while on prescription medicines. In the event of worsening symptoms, patient is instructed to call the crisis hotline, 911 and or go to the nearest ED for appropriate evaluation and treatment of symptoms. To follow-up with his/her primary care provider for your other medical issues, concerns and or health care needs.  Signed: Beau Fanny, FNP 06/05/2017, 10:53 AM  Patient seen, Suicide Assessment Completed.  Disposition Plan Reviewed

## 2017-06-05 NOTE — Progress Notes (Signed)
D:  Patient's self inventory sheet, patient sleeps good, sleep medication helpful.  Good appetite, normal energy level, good concentration.  Rated depression 2, denied hopeless, anxiety 5.  Denied withdrawals.  Denied SI.  Physical problems, back pain, worst pain in past 24 hours is #10, low back.  Goal is discharge.  Does have discharge plans. A:  Medications administered per MD orders.  Emotional support and encouragement given patient. R:  Denied SI and HI, contracts for safety.  Denied A/V hallucinations.  Safety maintained with 15 minute checks.

## 2017-06-05 NOTE — Progress Notes (Signed)
  [facility] Discharge Plan :  Will you be returning to the same living situation after discharge:  Yes,  pt returning home. At discharge, do you have transportation home?: Yes,  pt has access to transportation. Do you have the ability to pay for your medications: Yes,  prescriptions and samples provided.  Release of information consent forms completed and in the chart;  Patient's signature needed at discharge.  Patient to Follow up at: Follow-up Information    [facility] Follow up on 06/13/2017.   Why:  at 1:00pm for therapy with Chase and medication management at 2:15pm with Fransisca KaufmannLaura Davis on that same day.   Contact information: [address] [place] [address] [phone]           Next level of care provider [facility] Link:no  Safety Planning and Suicide Prevention discussed: Yes,  with pt and with pt's significant other.  Have you used any form of tobacco in the last 30 days? (Cigarettes, Smokeless Tobacco, Cigars, and/or Pipes): Yes  Has patient been referred to the Quitline?: Patient refused referral  Patient has been referred for addiction treatment: Yes  Jonathon JordanLynn B Jostin Rue, MSW, LCSWA 06/05/2017, 8:45 AM

## 2017-08-30 ENCOUNTER — Encounter (HOSPITAL_COMMUNITY): Payer: Self-pay | Admitting: Emergency Medicine

## 2017-08-30 ENCOUNTER — Emergency Department (HOSPITAL_COMMUNITY)
Admission: EM | Admit: 2017-08-30 | Discharge: 2017-08-30 | Disposition: A | Discharge status: Home / Self Care | Payer: Medicaid Other | Attending: Emergency Medicine | Admitting: Emergency Medicine

## 2017-08-30 DIAGNOSIS — M25512 Pain in left shoulder: Secondary | ICD-10-CM

## 2017-08-30 DIAGNOSIS — F329 Major depressive disorder, single episode, unspecified: Secondary | ICD-10-CM | POA: Diagnosis not present

## 2017-08-30 DIAGNOSIS — M542 Cervicalgia: Secondary | ICD-10-CM | POA: Diagnosis present

## 2017-08-30 DIAGNOSIS — Z91040 Latex allergy status: Secondary | ICD-10-CM | POA: Insufficient documentation

## 2017-08-30 DIAGNOSIS — Z79899 Other long term (current) drug therapy: Secondary | ICD-10-CM | POA: Diagnosis not present

## 2017-08-30 DIAGNOSIS — F17210 Nicotine dependence, cigarettes, uncomplicated: Secondary | ICD-10-CM | POA: Diagnosis not present

## 2017-08-30 MED ORDER — PREDNISONE 10 MG PO TABS: 20.0000 mg | ORAL_TABLET | Freq: Every day | ORAL | 0 refills | Status: DC

## 2017-08-30 MED ORDER — METHOCARBAMOL 500 MG PO TABS: 500.0000 mg | ORAL_TABLET | Freq: Four times a day (QID) | ORAL | 0 refills | Status: DC

## 2017-08-30 MED ORDER — CYCLOBENZAPRINE HCL 10 MG PO TABS: 5.0000 mg | ORAL_TABLET | Freq: Two times a day (BID) | ORAL | 0 refills | Status: DC | PRN

## 2017-08-30 NOTE — ED Triage Notes (Signed)
Pt repots left side neck pain into shoulder for over 1 month that is burning and sharp pain worse with movement of her neck pt states her neck feels "tight". Reports DDD in back with chronic pain.

## 2017-08-30 NOTE — ED Provider Notes (Signed)
[facility]-EMER[facility]ENCY DEPT Provider Note   CSN: [id number] Arrival date & time: 08/30/17  1243     History   Chief Complaint Chief Complaint  Patient presents with  . Torticollis  . Shoulder Pain    HPI Jill Cardenas is a 31 y.o. female.  HPI  Pt has a PMH of cholecystitis, depression, kidney stones, sciatica and two historys of back surgeries because of herniated discs but Dr. Dutch Quint. She come to the ER today for evaluation and for a referral back to Dr. Dutch Quint.   Her pain has been persisting for two weeks, she was seen in [facility] two weeks ago, not given any medications and advised she needs xrays but they were never done. She has tried non pharmacological treatments at home with mild relief. She is concerned she has another herniated disc. She see's her PCP in af ew weeks but does not want to wait that long for a referral.   She has associated headaches, neck muscles spasms, intermittent numbness and weakness to the left arm.  Past Medical History:  Diagnosis Date  . Cholecystitis   . Depression   . Kidney stones   . Sciatica     Patient Active Problem List   Diagnosis Date Noted  . MDD (major depressive disorder), recurrent severe, without psychosis (HCC) 06/01/2017  . MDD (major depressive disorder), recurrent episode, severe (HCC) 08/31/2016  . HNP (herniated nucleus pulposus), lumbar 11/05/2014  . 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 ANKLE ARTHROSCOPY W/ OPEN REPAIR Right   . cesarian    . LUMBAR LAMINECTOMY/DECOMPRESSION MICRODISCECTOMY Left 11/05/2014   Procedure: LUMBAR LAMINECTOMY/DECOMPRESSION MICRODISCECTOMY 1 LEVEL LUMBAR 4-5;  Surgeon: Temple Pacini, MD;  Location: [facility] OR;  Service: Neurosurgery;  Laterality: Left;  LUMBAR LAMINECTOMY/DECOMPRESSION MICRODISCECTOMY 1 LEVEL LUMBAR 4-5  . TUBAL LIGATION      OB History    No data available       Home Medications    Prior to  Admission medications   Medication Sig Start Date End Date Taking? Authorizing Provider  ARIPiprazole (ABILIFY) 5 MG tablet Take 1 tablet (5 mg total) by mouth daily. For mood control 06/06/17   Armandina Stammer I, NP  cyclobenzaprine (FLEXERIL) 10 MG tablet Take 0.5-1 tablets (5-10 mg total) by mouth 2 (two) times daily as needed. 08/30/17   Marlon Pel, PA-C  gabapentin (NEURONTIN) 100 MG capsule Take 1 capsule (100 mg total) by mouth 3 (three) times daily. For agitation 06/05/17   Armandina Stammer I, NP  hydrOXYzine (ATARAX/VISTARIL) 25 MG tablet Take 1 tablet (25 mg total) by mouth every 6 (six) hours as needed for anxiety. 06/05/17   Armandina Stammer I, NP  lamoTRIgine (LAMICTAL) 25 MG tablet Take 1 tablet (25 mg total) by mouth daily. For mood stabilization 06/06/17   Nwoko, Nicole Kindred I, NP  nicotine (NICODERM CQ - DOSED IN MG/24 HOURS) 21 mg/24hr patch Place 1 patch (21 mg total) onto the skin daily. For smoking cessation 06/06/17   Armandina Stammer I, NP  predniSONE (DELTASONE) 10 MG tablet Take 2 tablets (20 mg total) by mouth daily. 08/30/17   Marlon Pel, PA-C  sertraline (ZOLOFT) 100 MG tablet Take 1 tablet (100 mg total) by mouth daily. For depression 06/06/17   Armandina Stammer I, NP  traZODone (DESYREL) 50 MG tablet Take 1 tablet (50 mg total) by mouth at bedtime as needed for sleep. 06/05/17   Sanjuana Kava, NP  Family History Family History  Problem Relation Age of Onset  . Emphysema Mother     Social History Social History  Substance Use Topics  . Smoking status: Current Every Day Smoker    Packs/day: 1.00    Types: Cigarettes  . Smokeless tobacco: Never Used  . Alcohol use No     Allergies   Latex   Review of Systems Review of Systems Negative ROS aside from pertinent positives and negatives as listed in HPI   Physical Exam Updated Vital Signs BP 119/68 (BP Location: Right Arm)   Pulse (!) 110   Temp 98 F (36.7 C) (Oral)   Resp 16   SpO2 97%   Physical Exam    Constitutional: She appears well-developed and well-nourished. No distress.  HENT:  Head: Normocephalic and atraumatic.  Eyes: Pupils are equal, round, and reactive to light.  Neck: Normal range of motion. Neck supple. Muscular tenderness (to the left) present. No spinous process tenderness present. No neck rigidity. No erythema and normal range of motion present.  Cardiovascular: Normal rate and regular rhythm.   Pulmonary/Chest: Effort normal.  Abdominal: Soft.  Musculoskeletal:       Left shoulder: She exhibits tenderness, pain and spasm. She exhibits normal range of motion, no bony tenderness, no effusion, no crepitus, no deformity and no laceration. Decreased strength: strength is mildly decreased from left to right. She has fulle ROM and strength with passive movements.  Intact neurovascularly.   Neurological: She is alert.  Skin: Skin is warm and dry.  Nursing note and vitals reviewed.    ED Treatments / Results  Labs (all labs ordered are listed, but only abnormal results are displayed) Labs Reviewed - No data to display  EKG  EKG Interpretation None       Radiology No results found.  Procedures Procedures (including critical care time)  Medications Ordered in ED Medications - No data to display   Initial Impression / Assessment and Plan / ED Course  I have reviewed the triage vital signs and the nursing notes.  Pertinent labs & imaging results that were available during my care of the patient were reviewed by me and considered in my medical decision making (see chart for details).     Will give steroid dose back and muscle relaxers as well as refer to Dr. Dutch Quint as you will likely need an MRI and need a neurology evaluation   The patient denies having any red flag symptoms. No signs of abscess, tumor, hematoma. The patient has had no fevers, is not immune compromised, no history of IV drug use or bacteremia. No systemic history of cancer. The patient denies  weight loss and is not on any anticoagulations. There is been no traumatic injury. The patient denies any weakness, numbness, inability to walk.  I will refer to Ortho. Rx NSAIDS and muscle relaxers.  Blood pressure 119/68, pulse (!) 110, temperature 98 F (36.7 C), temperature source Oral, resp. rate 16, SpO2 97 %.  Jill Cardenas has been evaluated today in the emergency department. The appropriate screening and testing was been performed and I believe the patient to be medically stable for discharge.   Return signs and symptoms have been discussed with the patient and/or caregivers and they have voiced their understanding. The patient has agreed to follow-up with their primary care provider or the referred specialist.    Final Clinical Impressions(s) / ED Diagnoses   Final diagnoses:  Neck pain  Left shoulder pain, unspecified chronicity  New Prescriptions New Prescriptions   CYCLOBENZAPRINE (FLEXERIL) 10 MG TABLET    Take 0.5-1 tablets (5-10 mg total) by mouth 2 (two) times daily as needed.   PREDNISONE (DELTASONE) 10 MG TABLET    Take 2 tablets (20 mg total) by mouth daily.     Marlon Pel, PA-C 08/30/17 1612    Margarita Grizzle, MD 08/31/17 1051

## 2019-01-03 ENCOUNTER — Emergency Department (HOSPITAL_COMMUNITY): Payer: Medicaid Other

## 2019-01-03 ENCOUNTER — Emergency Department (HOSPITAL_COMMUNITY)
Admission: EM | Admit: 2019-01-03 | Discharge: 2019-01-03 | Disposition: A | Discharge status: Home / Self Care | Payer: Medicaid Other | Attending: Emergency Medicine | Admitting: Emergency Medicine

## 2019-01-03 ENCOUNTER — Encounter (HOSPITAL_COMMUNITY): Payer: Self-pay

## 2019-01-03 ENCOUNTER — Other Ambulatory Visit: Payer: Self-pay

## 2019-01-03 DIAGNOSIS — D72829 Elevated white blood cell count, unspecified: Secondary | ICD-10-CM

## 2019-01-03 DIAGNOSIS — F17210 Nicotine dependence, cigarettes, uncomplicated: Secondary | ICD-10-CM | POA: Diagnosis not present

## 2019-01-03 DIAGNOSIS — R079 Chest pain, unspecified: Secondary | ICD-10-CM | POA: Diagnosis present

## 2019-01-03 DIAGNOSIS — J0100 Acute maxillary sinusitis, unspecified: Secondary | ICD-10-CM | POA: Diagnosis not present

## 2019-01-03 DIAGNOSIS — M5412 Radiculopathy, cervical region: Secondary | ICD-10-CM | POA: Diagnosis not present

## 2019-01-03 DIAGNOSIS — Z79899 Other long term (current) drug therapy: Secondary | ICD-10-CM | POA: Insufficient documentation

## 2019-01-03 DIAGNOSIS — R51 Headache: Secondary | ICD-10-CM | POA: Insufficient documentation

## 2019-01-03 DIAGNOSIS — R0789 Other chest pain: Secondary | ICD-10-CM

## 2019-01-03 LAB — BASIC METABOLIC PANEL
Anion gap: 18 — ABNORMAL HIGH (ref 5–15)
BUN: 17 mg/dL (ref 6–20)
CO2: 18 mmol/L — ABNORMAL LOW (ref 22–32)
Calcium: 10.1 mg/dL (ref 8.9–10.3)
Chloride: 97 mmol/L — ABNORMAL LOW (ref 98–111)
Creatinine, Ser: 0.75 mg/dL (ref 0.44–1.00)
GFR calc Af Amer: >60 mL/min (ref >60)
GFR calc non Af Amer: >60 mL/min (ref >60)
Glucose, Bld: 100 mg/dL — ABNORMAL HIGH (ref 70–99)
Potassium: 3.9 mmol/L (ref 3.5–5.1)
Sodium: 133 mmol/L — ABNORMAL LOW (ref 135–145)

## 2019-01-03 LAB — HEPATIC FUNCTION PANEL
ALT: 19 U/L (ref 0–44)
AST: 26 U/L (ref 15–41)
Albumin: 4.4 g/dL (ref 3.5–5.0)
Alkaline Phosphatase: 65 U/L (ref 38–126)
Bilirubin, Direct: <0.1 mg/dL (ref 0.0–0.2)
Total Bilirubin: 0.3 mg/dL (ref 0.3–1.2)
Total Protein: 7.8 g/dL (ref 6.5–8.1)

## 2019-01-03 LAB — URINALYSIS, ROUTINE W REFLEX MICROSCOPIC
BILIRUBIN URINE: NEGATIVE
Glucose, UA: NEGATIVE mg/dL
Hgb urine dipstick: NEGATIVE
Ketones, ur: 5 mg/dL — AB
Leukocytes,Ua: NEGATIVE
Nitrite: NEGATIVE
Protein, ur: NEGATIVE mg/dL
Specific Gravity, Urine: 1.018 (ref 1.005–1.030)
pH: 6 (ref 5.0–8.0)

## 2019-01-03 LAB — CBC
HCT: 46.2 % — ABNORMAL HIGH (ref 36.0–46.0)
Hemoglobin: 15.6 g/dL — ABNORMAL HIGH (ref 12.0–15.0)
MCH: 29.4 pg (ref 26.0–34.0)
MCHC: 33.8 g/dL (ref 30.0–36.0)
MCV: 87.2 fL (ref 80.0–100.0)
Platelets: 483 10*3/uL — ABNORMAL HIGH (ref 150–400)
RBC: 5.3 MIL/uL — ABNORMAL HIGH (ref 3.87–5.11)
RDW: 13.2 % (ref 11.5–15.5)
WBC: 26.9 10*3/uL — ABNORMAL HIGH (ref 4.0–10.5)
nRBC: 0 % (ref 0.0–0.2)

## 2019-01-03 LAB — I-STAT BETA HCG BLOOD, ED (MC, WL, AP ONLY): I-stat hCG, quantitative: <5 m[IU]/mL (ref <5)

## 2019-01-03 LAB — TROPONIN I: Troponin I: <0.03 ng/mL (ref <0.03)

## 2019-01-03 MED ORDER — HYDROCODONE-ACETAMINOPHEN 5-325 MG PO TABS: 1.0000 | ORAL_TABLET | ORAL | 0 refills | Status: DC | PRN

## 2019-01-03 MED ORDER — IOPAMIDOL (ISOVUE-370) INJECTION 76%
INTRAVENOUS | Status: AC
  Filled 2019-01-03: qty 100

## 2019-01-03 MED ORDER — AMOXICILLIN-POT CLAVULANATE 875-125 MG PO TABS: 1.0000 | ORAL_TABLET | Freq: Two times a day (BID) | ORAL | 0 refills | Status: DC

## 2019-01-03 MED ORDER — IOPAMIDOL (ISOVUE-370) INJECTION 76%
100.0000 mL | Freq: Once | INTRAVENOUS | Status: AC
  Administered 2019-01-03: 100 mL via INTRAVENOUS

## 2019-01-03 MED ORDER — PREDNISONE 20 MG PO TABS
60.0000 mg | ORAL_TABLET | Freq: Once | ORAL | Status: AC
  Administered 2019-01-03: 60 mg via ORAL
  Filled 2019-01-03: qty 3

## 2019-01-03 MED ORDER — AMOXICILLIN-POT CLAVULANATE 875-125 MG PO TABS
1.0000 | ORAL_TABLET | Freq: Once | ORAL | Status: AC
  Administered 2019-01-03: 1 via ORAL
  Filled 2019-01-03: qty 1

## 2019-01-03 MED ORDER — OXYCODONE-ACETAMINOPHEN 5-325 MG PO TABS
1.0000 | ORAL_TABLET | Freq: Once | ORAL | Status: AC
  Administered 2019-01-03: 1 via ORAL
  Filled 2019-01-03: qty 1

## 2019-01-03 MED ORDER — MORPHINE SULFATE (PF) 4 MG/ML IV SOLN
4.0000 mg | Freq: Once | INTRAVENOUS | Status: AC
  Administered 2019-01-03: 4 mg via INTRAVENOUS
  Filled 2019-01-03: qty 1

## 2019-01-03 MED ORDER — SODIUM CHLORIDE 0.9 % IV BOLUS
1000.0000 mL | Freq: Once | INTRAVENOUS | Status: AC
  Administered 2019-01-03: 1000 mL via INTRAVENOUS

## 2019-01-03 MED ORDER — PREDNISONE 10 MG (21) PO TBPK: ORAL_TABLET | ORAL | 0 refills | Status: DC

## 2019-01-03 MED ORDER — ONDANSETRON HCL 4 MG/2ML IJ SOLN
4.0000 mg | Freq: Once | INTRAMUSCULAR | Status: AC
  Administered 2019-01-03: 4 mg via INTRAVENOUS
  Filled 2019-01-03: qty 2

## 2019-01-03 MED ORDER — ONDANSETRON 4 MG PO TBDP: 4.0000 mg | ORAL_TABLET | Freq: Three times a day (TID) | ORAL | 0 refills | Status: DC | PRN

## 2019-01-03 NOTE — ED Triage Notes (Signed)
Pt endorses central chest pain, left arm pain and neck pain with nausea x 3 days. VSS. NO cardiac hx.

## 2019-01-03 NOTE — ED Notes (Signed)
Pt attempting to urinate.

## 2019-01-03 NOTE — ED Notes (Signed)
Got patient undress on the monitor patient is resting with call bell in reach and family at bedside 

## 2019-01-03 NOTE — ED Provider Notes (Signed)
[facility] EMERGENCY DEPARTMENT Provider Note   CSN: [id number] Arrival date & time: 01/03/19  1829     History   Chief Complaint Chief Complaint  Patient presents with  . Chest Pain    HPI Jill Cardenas is a 33 y.o. female.  Pt presents to the ED today with "crushing" chest pain.  Pain is also in her neck.  She said sx have been going on since 2/10.  She denies h/a.  Pain in neck in posterior region with some numbness to her left hand.  CP in in the front of the chest.  She has tried tylenol/ibuprofen without improvement in sx.  She has never had CP like this in the past.  She has a hx of back problems and is worried her neck pain is going to require a surgery.     Past Medical History:  Diagnosis Date  . Cholecystitis   . Depression   . Kidney stones   . Sciatica     Patient Active Problem List   Diagnosis Date Noted  . MDD (major depressive disorder), recurrent severe, without psychosis (HCC) 06/01/2017  . MDD (major depressive disorder), recurrent episode, severe (HCC) 08/31/2016  . HNP (herniated nucleus pulposus), lumbar 11/05/2014  . 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 ANKLE ARTHROSCOPY W/ OPEN REPAIR Right   . cesarian    . LUMBAR LAMINECTOMY/DECOMPRESSION MICRODISCECTOMY Left 11/05/2014   Procedure: LUMBAR LAMINECTOMY/DECOMPRESSION MICRODISCECTOMY 1 LEVEL LUMBAR 4-5;  Surgeon: Temple Pacini, MD;  Location: [facility] OR;  Service: Neurosurgery;  Laterality: Left;  LUMBAR LAMINECTOMY/DECOMPRESSION MICRODISCECTOMY 1 LEVEL LUMBAR 4-5  . TUBAL LIGATION       OB History   No obstetric history on file.      Home Medications    Prior to Admission medications   Medication Sig Start Date End Date Taking? Authorizing Provider  acetaminophen (TYLENOL) 500 MG tablet Take 1,000 mg by mouth as needed for mild pain.   Yes [provider]  ARIPiprazole (ABILIFY) 5 MG  tablet Take 1 tablet (5 mg total) by mouth daily. For mood control 06/06/17  Yes Armandina Stammer I, NP  gabapentin (NEURONTIN) 100 MG capsule Take 1 capsule (100 mg total) by mouth 3 (three) times daily. For agitation Patient taking differently: Take 200 mg by mouth 3 (three) times daily.  06/05/17  Yes Armandina Stammer I, NP  hydrOXYzine (ATARAX/VISTARIL) 25 MG tablet Take 1 tablet (25 mg total) by mouth every 6 (six) hours as needed for anxiety. Patient taking differently: Take 50 mg by mouth at bedtime.  06/05/17  Yes Armandina Stammer I, NP  Multiple Vitamin (MULTIVITAMIN) tablet Take 1 tablet by mouth daily.   Yes [provider]  amoxicillin-clavulanate (AUGMENTIN) 875-125 MG tablet Take 1 tablet by mouth every 12 (twelve) hours. 01/03/19   Jacalyn Lefevre, MD  HYDROcodone-acetaminophen (NORCO/VICODIN) 5-325 MG tablet Take 1 tablet by mouth every 4 (four) hours as needed. 01/03/19   Jacalyn Lefevre, MD  lamoTRIgine (LAMICTAL) 25 MG tablet Take 1 tablet (25 mg total) by mouth daily. For mood stabilization Patient not taking: Reported on 01/03/2019 06/06/17   Armandina Stammer I, NP  lithium carbonate 150 MG capsule Take 150-300 mg by mouth See admin instructions. Take one tablet by in the morning and two tablets at bedtime 11/09/18   [provider]  methocarbamol (ROBAXIN) 500 MG tablet Take 1 tablet (500 mg total) by mouth 4 (four)  times daily. Patient not taking: Reported on 01/03/2019 08/30/17   Marlon PelGreene, Tiffany, PA-C  nicotine (NICODERM CQ - DOSED IN MG/24 HOURS) 21 mg/24hr patch Place 1 patch (21 mg total) onto the skin daily. For smoking cessation Patient not taking: Reported on 01/03/2019 06/06/17   Armandina StammerNwoko, Agnes I, NP  ondansetron (ZOFRAN ODT) 4 MG disintegrating tablet Take 1 tablet (4 mg total) by mouth every 8 (eight) hours as needed. 01/03/19   Jacalyn LefevreHaviland, Keane Martelli, MD  predniSONE (STERAPRED UNI-PAK 21 TAB) 10 MG (21) TBPK tablet Take 6 tabs for 2 days, then 5 for 2 days, then 4 for 2 days, then 3  for 2 days, 2 for 2 days, then 1 for 2 days 01/03/19   Jacalyn LefevreHaviland, Tyana Butzer, MD  sertraline (ZOLOFT) 100 MG tablet Take 1 tablet (100 mg total) by mouth daily. For depression Patient not taking: Reported on 01/03/2019 06/06/17   Armandina StammerNwoko, Agnes I, NP  traZODone (DESYREL) 50 MG tablet Take 1 tablet (50 mg total) by mouth at bedtime as needed for sleep. Patient not taking: Reported on 01/03/2019 06/05/17   Sanjuana KavaNwoko, Agnes I, NP    Family History Family History  Problem Relation Age of Onset  . Emphysema Mother     Social History Social History   Tobacco Use  . Smoking status: Current Every Day Smoker    Packs/day: 1.00    Types: Cigarettes  . Smokeless tobacco: Never Used  Substance Use Topics  . Alcohol use: No  . Drug use: No     Allergies   Latex   Review of Systems Review of Systems  Cardiovascular: Positive for chest pain.  Musculoskeletal: Positive for neck pain.  All other systems reviewed and are negative.    Physical Exam Updated Vital Signs BP 118/85   Pulse 85   Temp 98.5 F (36.9 C) (Oral)   Resp 16   SpO2 98%   Physical Exam Vitals signs and nursing note reviewed.  Constitutional:      Appearance: She is obese.  HENT:     Head: Normocephalic and atraumatic.  Eyes:     Extraocular Movements: Extraocular movements intact.     Pupils: Pupils are equal, round, and reactive to light.  Neck:     Musculoskeletal: Normal range of motion and neck supple.   Cardiovascular:     Rate and Rhythm: Regular rhythm. Tachycardia present.     Heart sounds: Normal heart sounds.  Pulmonary:     Effort: Pulmonary effort is normal.     Breath sounds: Normal breath sounds.  Abdominal:     General: Bowel sounds are normal.     Palpations: Abdomen is soft.  Musculoskeletal: Normal range of motion.  Skin:    General: Skin is warm and dry.     Capillary Refill: Capillary refill takes less than 2 seconds.  Neurological:     General: No focal deficit present.     Mental  Status: She is alert and oriented to person, place, and time.  Psychiatric:        Mood and Affect: Mood is anxious.      ED Treatments / Results  Labs (all labs ordered are listed, but only abnormal results are displayed) Labs Reviewed  BASIC METABOLIC PANEL - Abnormal; Notable for the following components:      Result Value   Sodium 133 (*)    Chloride 97 (*)    CO2 18 (*)    Glucose, Bld 100 (*)    Anion gap 18 (*)  All other components within normal limits  CBC - Abnormal; Notable for the following components:   WBC 26.9 (*)    RBC 5.30 (*)    Hemoglobin 15.6 (*)    HCT 46.2 (*)    Platelets 483 (*)    All other components within normal limits  URINALYSIS, ROUTINE W REFLEX MICROSCOPIC - Abnormal; Notable for the following components:   Color, Urine COLORLESS (*)    Ketones, ur 5 (*)    All other components within normal limits  TROPONIN I  HEPATIC FUNCTION PANEL  I-STAT BETA HCG BLOOD, ED ([facility], [facility], [facility] ONLY)    EKG EKG Interpretation  Date/Time:  Thursday January 03 2019 09:17:12 EST Ventricular Rate:  104 PR Interval:    QRS Duration: 89 QT Interval:  403 QTC Calculation: 531 R Axis:   45 Text Interpretation:  Sinus tachycardia Low voltage, precordial leads Borderline abnrm T, anterolateral leads Prolonged QT interval Baseline wander in lead(s) II III aVF V1 V2 V6 Since last tracing rate faster Confirmed by Jacalyn LefevreHaviland, Nida Manfredi [phone]) on 01/03/2019 9:50:53 AM   Radiology Dg Chest 2 View  Result Date: 01/03/2019 CLINICAL DATA:  Chest pain EXAM: CHEST - 2 VIEW COMPARISON:  None. FINDINGS: Lungs are clear. Heart size and pulmonary vascularity are normal. No adenopathy. No pneumothorax. There is mild midthoracic dextroscoliosis. IMPRESSION: No edema or consolidation. Electronically Signed   By: Bretta BangWilliam  Woodruff III M.D.   On: 01/03/2019 09:02   Ct Head Wo Contrast  Result Date: 01/03/2019 CLINICAL DATA:  Severe headache, worst headache of life EXAM: CT HEAD  WITHOUT CONTRAST CT CERVICAL SPINE WITHOUT CONTRAST TECHNIQUE: Multidetector CT imaging of the head and cervical spine was performed following the standard protocol without intravenous contrast. Multiplanar CT image reconstructions of the cervical spine were also generated. COMPARISON:  None. FINDINGS: CT HEAD FINDINGS Brain: No evidence of acute infarction, hemorrhage, hydrocephalus, extra-axial collection or mass lesion/mass effect. Vascular: No hyperdense vessel or unexpected calcification. Skull: Normal. Negative for fracture or focal lesion. Sinuses/Orbits: Total opacification of the left maxillary sinus and partial opacification of the left ethmoid air cells. Other: None. CT CERVICAL SPINE FINDINGS Alignment: Straightening of the normal cervical lordosis. Skull base and vertebrae: No acute fracture. No primary bone lesion or focal pathologic process. Soft tissues and spinal canal: No prevertebral fluid or swelling. No visible canal hematoma. Disc levels:  Intact. Upper chest: Negative. Other: None. IMPRESSION: 1. No acute intracranial pathology. No intracranial non-contrast CT findings to explain headache. 2. Total opacification of the left maxillary sinus and partial opacification of the left ethmoid air cells. Correlate for sinusitis. 3. No fracture or static subluxation of the cervical spine. Disc space heights are preserved. Straightening normal cervical lordosis, likely positional. Electronically Signed   By: Lauralyn PrimesAlex  Bibbey M.D.   On: 01/03/2019 10:20   Ct Angio Chest Pe W And/or Wo Contrast  Result Date: 01/03/2019 CLINICAL DATA:  Chest pain EXAM: CT ANGIOGRAPHY CHEST WITH CONTRAST TECHNIQUE: Multidetector CT imaging of the chest was performed using the standard protocol during bolus administration of intravenous contrast. Multiplanar CT image reconstructions and MIPs were obtained to evaluate the vascular anatomy. CONTRAST:  100mL ISOVUE-370 IOPAMIDOL (ISOVUE-370) INJECTION 76% COMPARISON:  Chest  radiograph January 03, 2019 FINDINGS: Cardiovascular: There is no demonstrable pulmonary embolus. There is no thoracic aortic aneurysm or dissection. The visualized great vessels appear normal. There is no pericardial effusion or pericardial thickening. Mediastinum/Nodes: Thyroid appears unremarkable. No evident thoracic adenopathy. No esophageal lesions are appreciable. Lungs/Pleura: There  are areas of patchy atelectasis bilaterally. There is no frank edema consolidation. No pleural effusion or pleural thickening evident. On axial slice 37 series 9, there is a 2 mm nodular opacity in the anterior segment right upper lobe. On axial slice 38 series 9, there is a 2 mm nodular opacity in the anterior segment of the right upper lobe. Upper Abdomen: Visualized upper abdominal structures appear unremarkable. Musculoskeletal: There are no blastic or lytic bone lesions. No chest wall lesions are evident. Review of the MIP images confirms the above findings. IMPRESSION: 1. No demonstrable pulmonary embolus. No thoracic aortic aneurysm or dissection. 2. Patchy atelectasis bilaterally. No lung edema or consolidation. 2 mm nodular opacities noted in right upper lobe. No follow-up needed if patient is low-risk (and has no known or suspected primary neoplasm). Non-contrast chest CT can be considered in 12 months if patient is high-risk. This recommendation follows the consensus statement: Guidelines for Management of Incidental Pulmonary Nodules Detected on CT Images: From the Fleischner Society 2017; Radiology 2017; 284:228-243. 3.  No demonstrable thoracic adenopathy. Electronically Signed   By: Bretta Bang III M.D.   On: 01/03/2019 10:24   Ct Cervical Spine Wo Contrast  Result Date: 01/03/2019 CLINICAL DATA:  Severe headache, worst headache of life EXAM: CT HEAD WITHOUT CONTRAST CT CERVICAL SPINE WITHOUT CONTRAST TECHNIQUE: Multidetector CT imaging of the head and cervical spine was performed following the standard  protocol without intravenous contrast. Multiplanar CT image reconstructions of the cervical spine were also generated. COMPARISON:  None. FINDINGS: CT HEAD FINDINGS Brain: No evidence of acute infarction, hemorrhage, hydrocephalus, extra-axial collection or mass lesion/mass effect. Vascular: No hyperdense vessel or unexpected calcification. Skull: Normal. Negative for fracture or focal lesion. Sinuses/Orbits: Total opacification of the left maxillary sinus and partial opacification of the left ethmoid air cells. Other: None. CT CERVICAL SPINE FINDINGS Alignment: Straightening of the normal cervical lordosis. Skull base and vertebrae: No acute fracture. No primary bone lesion or focal pathologic process. Soft tissues and spinal canal: No prevertebral fluid or swelling. No visible canal hematoma. Disc levels:  Intact. Upper chest: Negative. Other: None. IMPRESSION: 1. No acute intracranial pathology. No intracranial non-contrast CT findings to explain headache. 2. Total opacification of the left maxillary sinus and partial opacification of the left ethmoid air cells. Correlate for sinusitis. 3. No fracture or static subluxation of the cervical spine. Disc space heights are preserved. Straightening normal cervical lordosis, likely positional. Electronically Signed   By: Lauralyn Primes M.D.   On: 01/03/2019 10:20    Procedures Procedures (including critical care time)  Medications Ordered in ED Medications  iopamidol (ISOVUE-370) 76 % injection (has no administration in time range)  predniSONE (DELTASONE) tablet 60 mg (has no administration in time range)  amoxicillin-clavulanate (AUGMENTIN) 875-125 MG per tablet 1 tablet (has no administration in time range)  morphine 4 MG/ML injection 4 mg (4 mg Intravenous Given 01/03/19 0931)  ondansetron (ZOFRAN) injection 4 mg (4 mg Intravenous Given 01/03/19 0931)  sodium chloride 0.9 % bolus 1,000 mL (0 mLs Intravenous Stopped 01/03/19 1034)  iopamidol (ISOVUE-370) 76 %  injection 100 mL (100 mLs Intravenous Contrast Given 01/03/19 1003)  morphine 4 MG/ML injection 4 mg (4 mg Intravenous Given 01/03/19 1106)     Initial Impression / Assessment and Plan / ED Course  I have reviewed the triage vital signs and the nursing notes.  Pertinent labs & imaging results that were available during my care of the patient were reviewed by me and considered in  my medical decision making (see chart for details).    Pt's pain is better.  Etiology of CP is unclear.  Heart score of 1.  Unlikely to be CAD.  Nl ekg and trop.  No PE.  No Pna, just atelectasis.  Likely msk.  Etiology of neck pain is possible radiculopathy.  She said she had a MRI of her cervical spine 6 mo ago at Dr. Lindalou Hose office which showed a bulge.  CT neck today ok.  She has an appt with him next week.  She is told to keep that appt.  We will start her on steroids to help with that.  Cause of leukocytosis is unclear.  ? Sinusitis.  She is told her wbc is elevated and needs to be rechecked when well.    She knows to return if worse.    Final Clinical Impressions(s) / ED Diagnoses   Final diagnoses:  Acute non-recurrent maxillary sinusitis  Cervical radiculopathy  Leukocytosis, unspecified type  Atypical chest pain    ED Discharge Orders         Ordered    amoxicillin-clavulanate (AUGMENTIN) 875-125 MG tablet  Every 12 hours     01/03/19 1329    predniSONE (STERAPRED UNI-PAK 21 TAB) 10 MG (21) TBPK tablet     01/03/19 1329    HYDROcodone-acetaminophen (NORCO/VICODIN) 5-325 MG tablet  Every 4 hours PRN     01/03/19 1329    ondansetron (ZOFRAN ODT) 4 MG disintegrating tablet  Every 8 hours PRN     01/03/19 1329           Jacalyn Lefevre, MD 01/03/19 1333

## 2019-01-03 NOTE — ED Notes (Signed)
Pt transported to CT ?

## 2019-11-22 DIAGNOSIS — D72829 Elevated white blood cell count, unspecified: Secondary | ICD-10-CM

## 2019-11-22 HISTORY — DX: Elevated white blood cell count, unspecified: D72.829

## 2020-03-05 ENCOUNTER — Other Ambulatory Visit: Payer: Self-pay | Admitting: Neurosurgery

## 2020-03-16 NOTE — Pre-Procedure Instructions (Signed)
[id number] - [place], [place] - [address] [place] [address] Phone: [phone] Fax: [phone]      Your procedure is scheduled on Friday April 30th.  Report to [facility] Main Entrance "A" at 6:00 A.M., and check in at the Admitting office.  Call this number if you have problems the morning of surgery:  [phone]  Call [phone] if you have any questions prior to your surgery date Monday-Friday 8am-4pm    Remember:  Do not eat or drink after midnight the night before your surgery     Take these medicines the morning of surgery with A SIP OF WATER  ARIPiprazole (ABILIFY)  hydrOXYzine (ATARAX/VISTARIL)  lithium carbonate (ESKALITH) acetaminophen (TYLENOL) if needed   As of today, STOP taking any Aspirin (unless otherwise instructed by your surgeon) and Aspirin containing products, meloxicam (MOBIC), Aleve, Naproxen, Ibuprofen, Motrin, Advil, Goody's, BC's, all herbal medications, fish oil, and all vitamins.                      Do not wear jewelry, make up, or nail polish            Do not wear lotions, powders, perfumes/colognes, or deodorant.            Do not shave 48 hours prior to surgery.  Men may shave face and neck.            Do not bring valuables to the hospital.            [facility] is not responsible for any belongings or valuables.  Do NOT Smoke (Tobacco/Vapping) or drink Alcohol 24 hours prior to your procedure If you use a CPAP at night, you may bring all equipment for your overnight stay.   Contacts, glasses, dentures or bridgework may not be worn into surgery.      For patients admitted to the hospital, discharge time will be determined by your treatment team.   Patients discharged the day of surgery will not be allowed to drive home, and someone needs to stay with them for 24 hours.    Special instructions:   [facility]- Preparing For Surgery  Before surgery, you can play an important role. Because skin  is not sterile, your skin needs to be as free of germs as possible. You can reduce the number of germs on your skin by washing with CHG (chlorahexidine gluconate) Soap before surgery.  CHG is an antiseptic cleaner which kills germs and bonds with the skin to continue killing germs even after washing.    Oral Hygiene is also important to reduce your risk of infection.  Remember - BRUSH YOUR TEETH THE MORNING OF SURGERY WITH YOUR REGULAR TOOTHPASTE  Please do not use if you have an allergy to CHG or antibacterial soaps. If your skin becomes reddened/irritated stop using the CHG.  Do not shave (including legs and underarms) for at least 48 hours prior to first CHG shower. It is OK to shave your face.  Please follow these instructions carefully.   1. Shower the NIGHT BEFORE SURGERY and the MORNING OF SURGERY with CHG Soap.   2. If you chose to wash your hair, wash your hair first as usual with your normal shampoo.  3. After you shampoo, rinse your hair and body thoroughly to remove the shampoo.  4. Use CHG as you would any other liquid soap. You can apply CHG directly to the skin and wash gently  with a scrungie or a clean washcloth.   5. Apply the CHG Soap to your body ONLY FROM THE NECK DOWN.  Do not use on open wounds or open sores. Avoid contact with your eyes, ears, mouth and genitals (private parts). Wash Face and genitals (private parts)  with your normal soap.   6. Wash thoroughly, paying special attention to the area where your surgery will be performed.  7. Thoroughly rinse your body with warm water from the neck down.  8. DO NOT shower/wash with your normal soap after using and rinsing off the CHG Soap.  9. Pat yourself dry with a CLEAN TOWEL.  10. Wear CLEAN PAJAMAS to bed the night before surgery, wear comfortable clothes the morning of surgery  11. Place CLEAN SHEETS on your bed the night of your first shower and DO NOT SLEEP WITH PETS.   Day of Surgery:   Do not apply any  deodorants/lotions.  Please wear clean clothes to the hospital/surgery center.   Remember to brush your teeth WITH YOUR REGULAR TOOTHPASTE.   Please read over the following fact sheets that you were given.

## 2020-03-16 NOTE — Pre-Procedure Instructions (Signed)
Your procedure is scheduled on Friday April 30th.  Report to [facility] Main Entrance "A" at 6:00 A.M., and check in at the Admitting office.              Your surgery or procedure is scheduled for 8:00 AM.  Call this number if you have problems the morning of surgery:  [phone] this is the pre- surgery desk.  Call [phone] if you have any questions prior to your surgery date Monday-Friday 8am-4pm    Remember:  Do not eat or drink after midnight the night before your surgery   Take these medicines the morning of surgery with A SIP OF WATER  ARIPiprazole (ABILIFY)  lithium carbonate (ESKALITH)  If needed: acetaminophen (TYLENOL) if needed     hydrOXYzine (ATARAX/VISTARIL)   As of today, STOP taking any Aspirin (unless otherwise instructed by your surgeon) and Aspirin containing products, meloxicam (MOBIC), Aleve, Naproxen, Ibuprofen, Motrin, Advil, Goody's, BC's, all herbal medications, fish oil, and all vitamins.   Special instructions:   Do Not Smoke within 24 hours prior to surgery.    Do not shave 48 hours prior to surgery.  [facility]- Preparing For Surgery  Before surgery, you can play an important role. Because skin is not sterile, your skin needs to be as free of germs as possible. You can reduce the number of germs on your skin by washing with CHG (chlorahexidine gluconate) Soap before surgery.  CHG is an antiseptic cleaner which kills germs and bonds with the skin to continue killing germs even after washing.    Oral Hygiene is also important to reduce your risk of infection.  Remember - BRUSH YOUR TEETH THE MORNING OF SURGERY WITH YOUR REGULAR TOOTHPASTE  Please do not use if you have an allergy to CHG or antibacterial soaps. If your skin becomes reddened/irritated stop using the CHG.  Do not shave (including legs and underarms) for at least 48 hours prior to first CHG shower. It is OK to shave your face.  Please follow these instructions  carefully.   1. Shower the NIGHT BEFORE SURGERY and the MORNING OF SURGERY with CHG Soap.   2. If you chose to wash your hair, wash your hair first as usual with your normal shampoo.  3. After you shampoo, wash your face and private area with the soap you use at home, then rinse your hair and body thoroughly to remove the shampoo and soap.r   4. Use CHG as you would any other liquid soap. You can apply CHG directly to the skin and wash gently with a scrungie or a clean washcloth.   5. Apply the CHG Soap to your body ONLY FROM THE NECK DOWN.  Do not use on open wounds or open sores. Avoid contact with your eyes, ears, mouth and genitals (private parts).  6. Wash thoroughly, paying special attention to the area where your surgery will be performed.  7. Thoroughly rinse your body with warm water from the neck down.  8. DO NOT shower/wash with your normal soap after using and rinsing off the CHG Soap.  9. Pat yourself dry with a CLEAN TOWEL.  10. Wear CLEAN PAJAMAS to bed the night before surgery, wear comfortable clothes the morning of surgery  11. Place CLEAN SHEETS on your bed the night of your first shower and DO NOT SLEEP WITH PETS.  Day of Surgery: Shower as instructed above. Do not apply any deodorants/lotions/powders or colognes. Please wear clean clothes to the  hospital/surgery center.   Remember to brush your teeth WITH YOUR REGULAR TOOTHPASTE                   Do not wear jewelry, make up, or nail polish            Do not wear lotions, powders, perfumes/colognes, or deodorant.            Do not shave 48 hours prior to surgery.              Do not bring valuables to the hospital.            [facility] is not responsible for any belongings or valuables.  Contacts, glasses, dentures or bridgework may not be worn into surgery.      For patients admitted to the hospital, discharge time will be determined by your treatment team.   Patients discharged the day of surgery will  not be allowed to drive home, and someone needs to stay with them for 24 hours.   Please read over the following fact sheets that you were given.

## 2020-03-17 ENCOUNTER — Other Ambulatory Visit: Payer: Self-pay

## 2020-03-17 ENCOUNTER — Other Ambulatory Visit (HOSPITAL_COMMUNITY)
Admission: RE | Admit: 2020-03-17 | Discharge: 2020-03-17 | Disposition: A | Discharge status: Home / Self Care | Payer: Medicaid Other | Source: Ambulatory Visit | Attending: Neurosurgery | Admitting: Neurosurgery

## 2020-03-17 ENCOUNTER — Encounter (HOSPITAL_COMMUNITY): Payer: Self-pay

## 2020-03-17 ENCOUNTER — Encounter (HOSPITAL_COMMUNITY)
Admission: RE | Admit: 2020-03-17 | Discharge: 2020-03-17 | Disposition: A | Discharge status: Home / Self Care | Payer: Medicaid Other | Source: Ambulatory Visit | Attending: Neurosurgery | Admitting: Neurosurgery

## 2020-03-17 DIAGNOSIS — Z20822 Contact with and (suspected) exposure to covid-19: Secondary | ICD-10-CM | POA: Insufficient documentation

## 2020-03-17 DIAGNOSIS — Z01812 Encounter for preprocedural laboratory examination: Secondary | ICD-10-CM | POA: Insufficient documentation

## 2020-03-17 HISTORY — DX: Unspecified osteoarthritis, unspecified site: M19.90

## 2020-03-17 HISTORY — DX: Bipolar disorder, unspecified: F31.9

## 2020-03-17 HISTORY — DX: Headache, unspecified: R51.9

## 2020-03-17 LAB — BASIC METABOLIC PANEL
Anion gap: 11 (ref 5–15)
BUN: 11 mg/dL (ref 6–20)
CO2: 19 mmol/L — ABNORMAL LOW (ref 22–32)
Calcium: 9.9 mg/dL (ref 8.9–10.3)
Chloride: 110 mmol/L (ref 98–111)
Creatinine, Ser: 0.61 mg/dL (ref 0.44–1.00)
GFR calc Af Amer: >60 mL/min (ref >60)
GFR calc non Af Amer: >60 mL/min (ref >60)
Glucose, Bld: 86 mg/dL (ref 70–99)
Potassium: 4.3 mmol/L (ref 3.5–5.1)
Sodium: 140 mmol/L (ref 135–145)

## 2020-03-17 LAB — CBC WITH DIFFERENTIAL/PLATELET
Abs Immature Granulocytes: 0.06 10*3/uL (ref 0.00–0.07)
Basophils Absolute: 0.1 10*3/uL (ref 0.0–0.1)
Basophils Relative: 0 %
Eosinophils Absolute: 0.3 10*3/uL (ref 0.0–0.5)
Eosinophils Relative: 2 %
HCT: 46.9 % — ABNORMAL HIGH (ref 36.0–46.0)
Hemoglobin: 15.2 g/dL — ABNORMAL HIGH (ref 12.0–15.0)
Immature Granulocytes: 0 %
Lymphocytes Relative: 29 %
Lymphs Abs: 3.9 10*3/uL (ref 0.7–4.0)
MCH: 29.9 pg (ref 26.0–34.0)
MCHC: 32.4 g/dL (ref 30.0–36.0)
MCV: 92.1 fL (ref 80.0–100.0)
Monocytes Absolute: 1.2 10*3/uL — ABNORMAL HIGH (ref 0.1–1.0)
Monocytes Relative: 9 %
Neutro Abs: 7.9 10*3/uL — ABNORMAL HIGH (ref 1.7–7.7)
Neutrophils Relative %: 60 %
Platelets: 377 10*3/uL (ref 150–400)
RBC: 5.09 MIL/uL (ref 3.87–5.11)
RDW: 13.3 % (ref 11.5–15.5)
WBC: 13.4 10*3/uL — ABNORMAL HIGH (ref 4.0–10.5)
nRBC: 0 % (ref 0.0–0.2)

## 2020-03-17 LAB — SURGICAL PCR SCREEN
MRSA, PCR: POSITIVE — AB
Staphylococcus aureus: POSITIVE — AB

## 2020-03-17 LAB — SARS CORONAVIRUS 2 (TAT 6-24 HRS): SARS Coronavirus 2: NEGATIVE

## 2020-03-17 NOTE — Progress Notes (Addendum)
PCP - [facility]  Cardiologist - no  Chest x-ray - na  EKG - na  Stress Test - no  ECHO - no  Cardiac Cath - no  Sleep Study - no CPAP - no  LABS-CBC, BMP, PCR  ASA-no  ERAS-no  HA1C-na Fasting Blood Sugar - na Checks Blood Sugar _0____ times a day  Anesthesia-  Pt denies having chest pain, sob, or fever at this time. All instructions explained to the pt, with a verbal understanding of the material. Pt agrees to go over the instructions while at home for a better understanding. Pt also instructed to self quarantine after being tested for COVID-19. The opportunity to ask questions was provided.   Patient has IUD for heavy bleeding, had a tubal in past.

## 2020-03-20 ENCOUNTER — Encounter (HOSPITAL_COMMUNITY): Payer: Self-pay | Admitting: Neurosurgery

## 2020-03-20 ENCOUNTER — Ambulatory Visit (HOSPITAL_COMMUNITY): Payer: Medicaid Other | Admitting: Certified Registered Nurse Anesthetist

## 2020-03-20 ENCOUNTER — Other Ambulatory Visit: Payer: Self-pay

## 2020-03-20 ENCOUNTER — Encounter (HOSPITAL_COMMUNITY)
Admission: RE | Disposition: A | Discharge status: Home / Self Care | Payer: Self-pay | Source: Home / Self Care | Attending: Neurosurgery

## 2020-03-20 ENCOUNTER — Ambulatory Visit (HOSPITAL_COMMUNITY): Payer: Medicaid Other

## 2020-03-20 ENCOUNTER — Observation Stay (HOSPITAL_COMMUNITY)
Admission: RE | Admit: 2020-03-20 | Discharge: 2020-03-20 | Disposition: A | Discharge status: Home / Self Care | Payer: Medicaid Other | Attending: Neurosurgery | Admitting: Neurosurgery

## 2020-03-20 DIAGNOSIS — Z419 Encounter for procedure for purposes other than remedying health state, unspecified: Secondary | ICD-10-CM

## 2020-03-20 DIAGNOSIS — M5020 Other cervical disc displacement, unspecified cervical region: Secondary | ICD-10-CM | POA: Diagnosis present

## 2020-03-20 DIAGNOSIS — F319 Bipolar disorder, unspecified: Secondary | ICD-10-CM | POA: Diagnosis not present

## 2020-03-20 DIAGNOSIS — Z6834 Body mass index (BMI) 34.0-34.9, adult: Secondary | ICD-10-CM | POA: Insufficient documentation

## 2020-03-20 DIAGNOSIS — F17210 Nicotine dependence, cigarettes, uncomplicated: Secondary | ICD-10-CM | POA: Diagnosis not present

## 2020-03-20 DIAGNOSIS — Z791 Long term (current) use of non-steroidal anti-inflammatories (NSAID): Secondary | ICD-10-CM | POA: Insufficient documentation

## 2020-03-20 DIAGNOSIS — Z79899 Other long term (current) drug therapy: Secondary | ICD-10-CM | POA: Insufficient documentation

## 2020-03-20 DIAGNOSIS — M1990 Unspecified osteoarthritis, unspecified site: Secondary | ICD-10-CM | POA: Diagnosis not present

## 2020-03-20 DIAGNOSIS — M50122 Cervical disc disorder at C5-C6 level with radiculopathy: Principal | ICD-10-CM | POA: Insufficient documentation

## 2020-03-20 HISTORY — PX: ANTERIOR CERVICAL DECOMP/DISCECTOMY FUSION: SHX1161

## 2020-03-20 LAB — POCT PREGNANCY, URINE: Preg Test, Ur: NEGATIVE

## 2020-03-20 SURGERY — ANTERIOR CERVICAL DECOMPRESSION/DISCECTOMY FUSION 1 LEVEL
Anesthesia: General | Site: Neck

## 2020-03-20 MED ORDER — ACETAMINOPHEN 10 MG/ML IV SOLN: 1000.0000 mg | Freq: Once | INTRAVENOUS | Status: DC | PRN

## 2020-03-20 MED ORDER — FENTANYL CITRATE (PF) 100 MCG/2ML IJ SOLN
25.0000 ug | INTRAMUSCULAR | Status: DC | PRN
  Administered 2020-03-20: 25 ug via INTRAVENOUS

## 2020-03-20 MED ORDER — ACETAMINOPHEN 650 MG RE SUPP: 650.0000 mg | RECTAL | Status: DC | PRN

## 2020-03-20 MED ORDER — ROCURONIUM BROMIDE 10 MG/ML (PF) SYRINGE
PREFILLED_SYRINGE | INTRAVENOUS | Status: DC | PRN
  Administered 2020-03-20: 70 mg via INTRAVENOUS

## 2020-03-20 MED ORDER — CHLORHEXIDINE GLUCONATE CLOTH 2 % EX PADS: 6.0000 | MEDICATED_PAD | Freq: Once | CUTANEOUS | Status: DC

## 2020-03-20 MED ORDER — SODIUM CHLORIDE 0.9 % IV SOLN: 250.0000 mL | INTRAVENOUS | Status: DC

## 2020-03-20 MED ORDER — MENTHOL 3 MG MT LOZG: 1.0000 | LOZENGE | OROMUCOSAL | Status: DC | PRN

## 2020-03-20 MED ORDER — FENTANYL CITRATE (PF) 250 MCG/5ML IJ SOLN
INTRAMUSCULAR | Status: DC | PRN
  Administered 2020-03-20: 50 ug via INTRAVENOUS
  Administered 2020-03-20: 100 ug via INTRAVENOUS
  Administered 2020-03-20 (×2): 50 ug via INTRAVENOUS

## 2020-03-20 MED ORDER — PROPOFOL 10 MG/ML IV BOLUS
INTRAVENOUS | Status: AC
  Filled 2020-03-20: qty 40

## 2020-03-20 MED ORDER — MIDAZOLAM HCL 5 MG/5ML IJ SOLN
INTRAMUSCULAR | Status: DC | PRN
  Administered 2020-03-20: 2 mg via INTRAVENOUS

## 2020-03-20 MED ORDER — MELOXICAM 7.5 MG PO TABS
15.0000 mg | ORAL_TABLET | Freq: Every day | ORAL | Status: DC
  Filled 2020-03-20: qty 2

## 2020-03-20 MED ORDER — HYDROXYZINE HCL 25 MG PO TABS: 50.0000 mg | ORAL_TABLET | Freq: Four times a day (QID) | ORAL | Status: DC | PRN

## 2020-03-20 MED ORDER — DEXAMETHASONE SODIUM PHOSPHATE 10 MG/ML IJ SOLN
INTRAMUSCULAR | Status: AC
  Filled 2020-03-20: qty 1

## 2020-03-20 MED ORDER — SODIUM CHLORIDE 0.9% FLUSH: 3.0000 mL | INTRAVENOUS | Status: DC | PRN

## 2020-03-20 MED ORDER — SODIUM CHLORIDE 0.9% FLUSH: 3.0000 mL | Freq: Two times a day (BID) | INTRAVENOUS | Status: DC

## 2020-03-20 MED ORDER — OXYCODONE HCL 5 MG PO TABS
5.0000 mg | ORAL_TABLET | Freq: Once | ORAL | Status: AC | PRN
  Administered 2020-03-20: 5 mg via ORAL

## 2020-03-20 MED ORDER — MIDAZOLAM HCL 2 MG/2ML IJ SOLN
INTRAMUSCULAR | Status: AC
  Filled 2020-03-20: qty 2

## 2020-03-20 MED ORDER — CYCLOBENZAPRINE HCL 10 MG PO TABS
10.0000 mg | ORAL_TABLET | Freq: Three times a day (TID) | ORAL | Status: DC | PRN
  Administered 2020-03-20: 10 mg via ORAL
  Filled 2020-03-20: qty 1

## 2020-03-20 MED ORDER — DEXAMETHASONE SODIUM PHOSPHATE 10 MG/ML IJ SOLN
10.0000 mg | Freq: Once | INTRAMUSCULAR | Status: AC
  Administered 2020-03-20: 10 mg via INTRAVENOUS
  Filled 2020-03-20: qty 1

## 2020-03-20 MED ORDER — LIDOCAINE 2% (20 MG/ML) 5 ML SYRINGE
INTRAMUSCULAR | Status: AC
  Filled 2020-03-20: qty 5

## 2020-03-20 MED ORDER — THROMBIN 5000 UNITS EX SOLR
CUTANEOUS | Status: AC
  Filled 2020-03-20: qty 15000

## 2020-03-20 MED ORDER — CEFAZOLIN SODIUM-DEXTROSE 2-4 GM/100ML-% IV SOLN
2.0000 g | INTRAVENOUS | Status: AC
  Administered 2020-03-20: 2 g via INTRAVENOUS
  Filled 2020-03-20: qty 100

## 2020-03-20 MED ORDER — OXYCODONE HCL 5 MG PO TABS
ORAL_TABLET | ORAL | Status: AC
  Filled 2020-03-20: qty 1

## 2020-03-20 MED ORDER — SUGAMMADEX SODIUM 200 MG/2ML IV SOLN
INTRAVENOUS | Status: DC | PRN
  Administered 2020-03-20: 200 mg via INTRAVENOUS

## 2020-03-20 MED ORDER — OXYCODONE HCL 5 MG/5ML PO SOLN: 5.0000 mg | Freq: Once | ORAL | Status: AC | PRN

## 2020-03-20 MED ORDER — VANCOMYCIN HCL IN DEXTROSE 1-5 GM/200ML-% IV SOLN
1000.0000 mg | INTRAVENOUS | Status: AC
  Administered 2020-03-20: 1000 mg via INTRAVENOUS
  Filled 2020-03-20: qty 200

## 2020-03-20 MED ORDER — ARIPIPRAZOLE 5 MG PO TABS
15.0000 mg | ORAL_TABLET | Freq: Every morning | ORAL | Status: DC
  Filled 2020-03-20: qty 1

## 2020-03-20 MED ORDER — PROPOFOL 10 MG/ML IV BOLUS
INTRAVENOUS | Status: DC | PRN
  Administered 2020-03-20: 160 mg via INTRAVENOUS

## 2020-03-20 MED ORDER — ACETAMINOPHEN 160 MG/5ML PO SOLN: 1000.0000 mg | Freq: Once | ORAL | Status: DC | PRN

## 2020-03-20 MED ORDER — HYDROCODONE-ACETAMINOPHEN 5-325 MG PO TABS: 1.0000 | ORAL_TABLET | ORAL | Status: DC | PRN

## 2020-03-20 MED ORDER — HYDROCODONE-ACETAMINOPHEN 5-325 MG PO TABS: 1.0000 | ORAL_TABLET | ORAL | 0 refills | Status: DC | PRN

## 2020-03-20 MED ORDER — FENTANYL CITRATE (PF) 100 MCG/2ML IJ SOLN
INTRAMUSCULAR | Status: AC
  Filled 2020-03-20: qty 2

## 2020-03-20 MED ORDER — ONDANSETRON HCL 4 MG PO TABS: 4.0000 mg | ORAL_TABLET | Freq: Four times a day (QID) | ORAL | Status: DC | PRN

## 2020-03-20 MED ORDER — 0.9 % SODIUM CHLORIDE (POUR BTL) OPTIME
TOPICAL | Status: DC | PRN
  Administered 2020-03-20: 09:00:00 1000 mL

## 2020-03-20 MED ORDER — CEFAZOLIN SODIUM-DEXTROSE 1-4 GM/50ML-% IV SOLN: 1.0000 g | Freq: Three times a day (TID) | INTRAVENOUS | Status: DC

## 2020-03-20 MED ORDER — HEMOSTATIC AGENTS (NO CHARGE) OPTIME
TOPICAL | Status: DC | PRN
  Administered 2020-03-20: 1 via TOPICAL

## 2020-03-20 MED ORDER — ONDANSETRON HCL 4 MG/2ML IJ SOLN
INTRAMUSCULAR | Status: DC | PRN
  Administered 2020-03-20: 4 mg via INTRAVENOUS

## 2020-03-20 MED ORDER — CYCLOBENZAPRINE HCL 10 MG PO TABS: 10.0000 mg | ORAL_TABLET | Freq: Three times a day (TID) | ORAL | 0 refills | Status: DC | PRN

## 2020-03-20 MED ORDER — THROMBIN 5000 UNITS EX SOLR
CUTANEOUS | Status: DC | PRN
  Administered 2020-03-20 (×2): 5000 [IU] via TOPICAL

## 2020-03-20 MED ORDER — HYDROMORPHONE HCL 1 MG/ML IJ SOLN: 1.0000 mg | INTRAMUSCULAR | Status: DC | PRN

## 2020-03-20 MED ORDER — HYDROCODONE-ACETAMINOPHEN 10-325 MG PO TABS
2.0000 | ORAL_TABLET | ORAL | Status: DC | PRN
  Administered 2020-03-20: 2 via ORAL
  Filled 2020-03-20: qty 2

## 2020-03-20 MED ORDER — PHENOL 1.4 % MT LIQD: 1.0000 | OROMUCOSAL | Status: DC | PRN

## 2020-03-20 MED ORDER — LACTATED RINGERS IV SOLN: INTRAVENOUS | Status: DC | PRN

## 2020-03-20 MED ORDER — ONDANSETRON HCL 4 MG/2ML IJ SOLN: 4.0000 mg | Freq: Four times a day (QID) | INTRAMUSCULAR | Status: DC | PRN

## 2020-03-20 MED ORDER — ACETAMINOPHEN 325 MG PO TABS: 650.0000 mg | ORAL_TABLET | ORAL | Status: DC | PRN

## 2020-03-20 MED ORDER — LIDOCAINE 2% (20 MG/ML) 5 ML SYRINGE
INTRAMUSCULAR | Status: DC | PRN
  Administered 2020-03-20: 80 mg via INTRAVENOUS

## 2020-03-20 MED ORDER — ACETAMINOPHEN 500 MG PO TABS: 1000.0000 mg | ORAL_TABLET | Freq: Once | ORAL | Status: DC | PRN

## 2020-03-20 MED ORDER — SODIUM CHLORIDE 0.9 % IV SOLN: INTRAVENOUS | Status: DC | PRN

## 2020-03-20 MED ORDER — LITHIUM CARBONATE ER 450 MG PO TBCR
450.0000 mg | EXTENDED_RELEASE_TABLET | Freq: Two times a day (BID) | ORAL | Status: DC
  Filled 2020-03-20 (×2): qty 1

## 2020-03-20 MED ORDER — FENTANYL CITRATE (PF) 250 MCG/5ML IJ SOLN
INTRAMUSCULAR | Status: AC
  Filled 2020-03-20: qty 5

## 2020-03-20 MED ORDER — ONDANSETRON HCL 4 MG/2ML IJ SOLN
INTRAMUSCULAR | Status: AC
  Filled 2020-03-20: qty 2

## 2020-03-20 SURGICAL SUPPLY — 63 items
APL SKNCLS STERI-STRIP NONHPOA (GAUZE/BANDAGES/DRESSINGS) ×1
BAG DECANTER FOR FLEXI CONT (MISCELLANEOUS) ×2 IMPLANT
BENZOIN TINCTURE PRP APPL 2/3 (GAUZE/BANDAGES/DRESSINGS) ×2 IMPLANT
BIT DRILL 13 (BIT) ×2 IMPLANT
BUR MATCHSTICK NEURO 3.0 LAGG (BURR) ×2 IMPLANT
CAGE PEEK 6X14X11 (Cage) ×2 IMPLANT
CANISTER SUCT 3000ML PPV (MISCELLANEOUS) ×2 IMPLANT
CARTRIDGE OIL MAESTRO DRILL (MISCELLANEOUS) ×1 IMPLANT
COVER WAND RF STERILE (DRAPES) IMPLANT
DIFFUSER DRILL AIR PNEUMATIC (MISCELLANEOUS) ×2 IMPLANT
DRAPE C-ARM 42X72 X-RAY (DRAPES) ×4 IMPLANT
DRAPE LAPAROTOMY 100X72 PEDS (DRAPES) ×2 IMPLANT
DRAPE MICROSCOPE LEICA (MISCELLANEOUS) ×2 IMPLANT
DURAPREP 6ML APPLICATOR 50/CS (WOUND CARE) ×2 IMPLANT
ELECT COATED BLADE 2.86 ST (ELECTRODE) ×2 IMPLANT
ELECT REM PT RETURN 9FT ADLT (ELECTROSURGICAL) ×2
ELECTRODE REM PT RTRN 9FT ADLT (ELECTROSURGICAL) ×1 IMPLANT
GAUZE 4X4 16PLY RFD (DISPOSABLE) IMPLANT
GAUZE SPONGE 4X4 12PLY STRL (GAUZE/BANDAGES/DRESSINGS) ×2 IMPLANT
GLOVE BIO SURGEON STRL SZ 6.5 (GLOVE) IMPLANT
GLOVE BIOGEL PI IND STRL 6.5 (GLOVE) ×1 IMPLANT
GLOVE BIOGEL PI IND STRL 7.0 (GLOVE) IMPLANT
GLOVE BIOGEL PI IND STRL 7.5 (GLOVE) ×2 IMPLANT
GLOVE BIOGEL PI INDICATOR 6.5 (GLOVE) ×1
GLOVE BIOGEL PI INDICATOR 7.0 (GLOVE)
GLOVE BIOGEL PI INDICATOR 7.5 (GLOVE) ×2
GLOVE ECLIPSE 9.0 STRL (GLOVE) IMPLANT
GLOVE EXAM NITRILE XL STR (GLOVE) IMPLANT
GLOVE SS PI 9.0 STRL (GLOVE) ×2 IMPLANT
GLOVE SURG SS PI 6.5 STRL IVOR (GLOVE) ×2 IMPLANT
GLOVE SURG SS PI 7.0 STRL IVOR (GLOVE) ×6 IMPLANT
GLOVE SURG SS PI 7.5 STRL IVOR (GLOVE) ×4 IMPLANT
GOWN STRL REUS W/ TWL LRG LVL3 (GOWN DISPOSABLE) ×2 IMPLANT
GOWN STRL REUS W/ TWL XL LVL3 (GOWN DISPOSABLE) ×2 IMPLANT
GOWN STRL REUS W/TWL 2XL LVL3 (GOWN DISPOSABLE) IMPLANT
GOWN STRL REUS W/TWL LRG LVL3 (GOWN DISPOSABLE) ×4
GOWN STRL REUS W/TWL XL LVL3 (GOWN DISPOSABLE) ×4
HALTER HD/CHIN CERV TRACTION D (MISCELLANEOUS) ×2 IMPLANT
HEMOSTAT POWDER KIT SURGIFOAM (HEMOSTASIS) IMPLANT
KIT BASIN OR (CUSTOM PROCEDURE TRAY) ×2 IMPLANT
KIT TURNOVER KIT B (KITS) ×2 IMPLANT
NEEDLE SPNL 20GX3.5 QUINCKE YW (NEEDLE) ×2 IMPLANT
NS IRRIG 1000ML POUR BTL (IV SOLUTION) ×2 IMPLANT
OIL CARTRIDGE MAESTRO DRILL (MISCELLANEOUS) ×2
PACK LAMINECTOMY NEURO (CUSTOM PROCEDURE TRAY) ×2 IMPLANT
PAD ARMBOARD 7.5X6 YLW CONV (MISCELLANEOUS) ×4 IMPLANT
PLATE 23MM (Plate) ×2 IMPLANT
RUBBERBAND STERILE (MISCELLANEOUS) ×4 IMPLANT
SCREW ST 13X4XST VA NS SPNE (Screw) ×4 IMPLANT
SCREW ST VAR 4 ATL (Screw) ×8 IMPLANT
SPACER SPNL 11X14X6XPEEK CVD (Cage) ×1 IMPLANT
SPCR SPNL 11X14X6XPEEK CVD (Cage) ×1 IMPLANT
SPONGE INTESTINAL PEANUT (DISPOSABLE) ×2 IMPLANT
SPONGE SURGIFOAM ABS GEL SZ50 (HEMOSTASIS) ×2 IMPLANT
STRIP CLOSURE SKIN 1/2X4 (GAUZE/BANDAGES/DRESSINGS) ×2 IMPLANT
SUT VIC AB 3-0 SH 8-18 (SUTURE) ×2 IMPLANT
SUT VIC AB 4-0 RB1 18 (SUTURE) ×2 IMPLANT
TAPE CLOTH 4X10 WHT NS (GAUZE/BANDAGES/DRESSINGS) ×2 IMPLANT
TAPE CLOTH SURG 4X10 WHT LF (GAUZE/BANDAGES/DRESSINGS) ×2 IMPLANT
TOWEL GREEN STERILE (TOWEL DISPOSABLE) ×2 IMPLANT
TOWEL GREEN STERILE FF (TOWEL DISPOSABLE) ×2 IMPLANT
TRAP SPECIMEN MUCOUS 40CC (MISCELLANEOUS) ×2 IMPLANT
WATER STERILE IRR 1000ML POUR (IV SOLUTION) ×2 IMPLANT

## 2020-03-20 NOTE — Op Note (Signed)
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  Asst. Surgeon: Harrold Donath, NP  Anesthesia: General  Indication: 34 year old female with neck and left upper extremity pain paresthesias and weakness failing conservative management work-up demonstrates evidence of a left-sided C5-6 disc herniation with marked compression of the left C6 nerve root.  Patient presents now for anterior cervical decompression and fusion in hopes of improving her symptoms.  Operative note: After induction anesthesia, patient positioned supine with neck slightly extended held placed halter traction.  Patient's anterior cervical region prepped draped sterilely.  Incision made overlying C5-6.  Dissection performed on the right.  Retractor placed.  X-ray taken.  Level confirmed.  To space in size 15 blade.  Discectomy then performed using various instruments down to level the posterior annulus.  Microscope then brought to the field used throughout the remainder of the discectomy.  Remaining aspects of annulus and osteophytes removed using high-speed drill down to level of the posterior longitudinal ligament.  Posterior logical movement was then elevated and resected piecemeal fashion.  A wide central decompression then performed undercutting the bodies of C5 and C6.  Decompression then proceeded into each neural foramina.  Wide anterior foraminotomies were performed on the course exiting C6 nerve roots including removal of all the disc herniation on the left side at C5-6.  At this point a very thorough decompression been achieved.  There was no evidence of injury to the thecal sac  or nerve roots.  Wound was then irrigated with antibiotic solution.  Gelfoam was placed topically for hemostasis then removed.  A 6 mm [employer] anatomic peek cage packed with locally harvested autograft was then packed into place and recessed slightly from the anterior cortical margin of C5 and C6.  23 mm Atlantis anterior cervical plate was then placed over the C5 and C6 levels.  This then attached under fluoroscopic guidance using 13 mm variable angle screws to each of both levels.  All screws given final tightening found to be solidly within the bone.  Locking screws were then engaged both levels.  Final images reveal good position of the cages and the hardware at the proper upper level with normal alignment of spine.  Wound is inspected for hemostasis.  Is then closed in typical fashion.  Steri-Strips and sterile dressing were applied.  No apparent complications.  Patient tolerated the procedure well and she returns to the recovery room postop.

## 2020-03-20 NOTE — Transfer of Care (Signed)
Immediate Anesthesia Transfer of Care Note  Patient: Jill Cardenas  Procedure(s) Performed: Anterior Cervical Decompression Discectomy Fusion - Cervical five-six (N/A Neck)  Patient Location: PACU  Anesthesia Type:General  Level of Consciousness: awake, alert , oriented and patient cooperative  Airway & Oxygen Therapy: Patient Spontanous Breathing and Patient connected to nasal cannula oxygen  Post-op Assessment: Report given to RN, Post -op Vital signs reviewed and stable and Patient moving all extremities X 4  Post vital signs: Reviewed and stable  Last Vitals:  Vitals Value Taken Time  BP 135/99 03/20/20 0938  Temp    Pulse 92 03/20/20 0940  Resp 19 03/20/20 0940  SpO2 99 % 03/20/20 0940  Vitals shown include unvalidated device data.  Last Pain:  Vitals:   03/20/20 0705  TempSrc:   PainSc: 7          Complications: No apparent anesthesia complications

## 2020-03-20 NOTE — Brief Op Note (Signed)
03/20/2020  9:29 AM  PATIENT:  Jill Cardenas  34 y.o. female  PRE-OPERATIVE DIAGNOSIS:  Stenosis  POST-OPERATIVE DIAGNOSIS:  Stenosis  PROCEDURE:  Procedure(s): Anterior Cervical Decompression Discectomy Fusion - Cervical five-six (N/A)  SURGEON:  Surgeon(s) and Role:    * Julio Sicks, MD - Primary  PHYSICIAN ASSISTANT:   ASSISTANTS:  Johnsie Cancel  ANESTHESIA:   general  EBL:  25 mL   BLOOD ADMINISTERED:none  DRAINS: none   LOCAL MEDICATIONS USED: None  SPECIMEN:  No Specimen  DISPOSITION OF SPECIMEN:  N/A  COUNTS:  YES  TOURNIQUET:  * No tourniquets in log *  DICTATION: .Dragon Dictation  PLAN OF CARE: Admit for overnight observation  PATIENT DISPOSITION:  PACU - hemodynamically stable.   Delay start of Pharmacological VTE agent (>24hrs) due to surgical blood loss or risk of bleeding: yes

## 2020-03-20 NOTE — Anesthesia Preprocedure Evaluation (Signed)
Anesthesia Evaluation  Patient identified by MRN, date of birth, ID band Patient awake    Reviewed: Allergy & Precautions, NPO status , Patient's Chart, lab work & pertinent test results  History of Anesthesia Complications Negative for: history of anesthetic complications  Airway Mallampati: I  TM Distance: >3 FB Neck ROM: Full    Dental  (+) Dental Advisory Given, Teeth Intact   Pulmonary Current Smoker and Patient abstained from smoking.,    breath sounds clear to auscultation       Cardiovascular negative cardio ROS   Rhythm:Regular     Neuro/Psych  Headaches, PSYCHIATRIC DISORDERS Depression Bipolar Disorder  Neuromuscular disease    GI/Hepatic negative GI ROS, Neg liver ROS,   Endo/Other  neg diabetesMorbid obesity  Renal/GU negative Renal ROS     Musculoskeletal  (+) Arthritis ,   Abdominal   Peds  Hematology   Anesthesia Other Findings   Reproductive/Obstetrics                             Anesthesia Physical Anesthesia Plan  ASA: II  Anesthesia Plan: General   Post-op Pain Management:    Induction: Intravenous  PONV Risk Score and Plan: 2 and Ondansetron and Dexamethasone  Airway Management Planned: Oral ETT  Additional Equipment: None  Intra-op Plan:   Post-operative Plan: Extubation in OR  Informed Consent: I have reviewed the patients History and Physical, chart, labs and discussed the procedure including the risks, benefits and alternatives for the proposed anesthesia with the patient or authorized representative who has indicated his/her understanding and acceptance.     Dental advisory given  Plan Discussed with: CRNA and Surgeon  Anesthesia Plan Comments:         Anesthesia Quick Evaluation

## 2020-03-20 NOTE — Discharge Summary (Signed)
Physician Discharge Summary  Patient ID: Jill Cardenas MRN: [id number] DOB/AGE: 35-30-1987 35 y.o.  Admit date: 03/20/2020 Discharge date: 03/20/2020  Admission Diagnoses:  Discharge Diagnoses:  Active Problems:   HNP (herniated nucleus pulposus), cervical   Discharged Condition: good  Hospital Course: Patient been to the hospital where she underwent uncomplicated C5-6 anterior cervical decompression and fusion.  Postop Lee patient doing very well.  She is mobilized without difficulty.  Preoperative pain is resolved.  Wound is healing well.  Swallowing well.  Voice strong.  Consults:   Significant Diagnostic Studies:   Treatments:   Discharge Exam: Blood pressure 122/78, pulse 88, temperature 98.2 F (36.8 C), resp. rate 18, height 5\' 3"  (1.6 m), weight 89.4 kg, SpO2 100 %. Awake and alert.  Oriented and appropriate.  Motor and sensory function intact.  Wound clean and dry.  Chest and abdomen benign.  Disposition: Discharge disposition: 01-Home or Self Care        Allergies as of 03/20/2020      Reactions   Latex Hives      Medication List    TAKE these medications   acetaminophen 500 MG tablet Commonly known as: TYLENOL Take 1,000 mg by mouth daily as needed for mild pain.   ARIPiprazole 15 MG tablet Commonly known as: ABILIFY Take 15 mg by mouth every morning.   cyclobenzaprine 10 MG tablet Commonly known as: FLEXERIL Take 1 tablet (10 mg total) by mouth 3 (three) times daily as needed for muscle spasms.   HYDROcodone-acetaminophen 5-325 MG tablet Commonly known as: NORCO/VICODIN Take 1 tablet by mouth every 4 (four) hours as needed for moderate pain ((score 4 to 6)).   hydrOXYzine 25 MG tablet Commonly known as: ATARAX/VISTARIL Take 1 tablet (25 mg total) by mouth every 6 (six) hours as needed for anxiety. What changed:   how much to take  when to take this   lithium carbonate 450 MG CR tablet Commonly known as: ESKALITH Take 450 mg by mouth 2  (two) times daily.   meloxicam 15 MG tablet Commonly known as: MOBIC Take 15 mg by mouth daily.   predniSONE 10 MG (21) Tbpk tablet Commonly known as: STERAPRED UNI-PAK 21 TAB Take 6 tabs for 2 days, then 5 for 2 days, then 4 for 2 days, then 3 for 2 days, 2 for 2 days, then 1 for 2 days        Signed: 03/22/2020 03/20/2020, 12:25 PM

## 2020-03-20 NOTE — H&P (Signed)
Jill Cardenas is an 34 y.o. female.   Chief Complaint: Pain HPI: 34 year old female with neck and left upper extremity pain paresthesias and some weakness.  Work-up demonstrates evidence of a significant left-sided C5-6 disc herniation with compression of her left-sided C6 nerve root.  Patient has failed conservative management presents now for anterior cervical decompression and fusion in hopes of improving her symptoms.  Past Medical History:  Diagnosis Date  . Arthritis    OA  . Bipolar disorder (HCC)   . Cholecystitis   . Depression   . Headache    MIgraines - "more since neck started hurting."  . Sciatica     Past Surgical History:  Procedure Laterality Date  . ANKLE ARTHROSCOPY W/ OPEN REPAIR Right   . BACK SURGERY     lumbar fusion  . CESAREAN SECTION    . LUMBAR LAMINECTOMY/DECOMPRESSION MICRODISCECTOMY Left 11/05/2014   Procedure: LUMBAR LAMINECTOMY/DECOMPRESSION MICRODISCECTOMY 1 LEVEL LUMBAR 4-5;  Surgeon: Jill Pacini, MD;  Location: [facility] OR;  Service: Neurosurgery;  Laterality: Left;  LUMBAR LAMINECTOMY/DECOMPRESSION MICRODISCECTOMY 1 LEVEL LUMBAR 4-5  . TUBAL LIGATION      Family History  Problem Relation Age of Onset  . Emphysema Mother    Social History:  reports that she has been smoking cigarettes. She has a 7.50 pack-year smoking history. She has never used smokeless tobacco. She reports that she does not drink alcohol or use drugs.  Allergies:  Allergies  Allergen Reactions  . Latex Hives    Medications Prior to Admission  Medication Sig Dispense Refill  . acetaminophen (TYLENOL) 500 MG tablet Take 1,000 mg by mouth daily as needed for mild pain.     . ARIPiprazole (ABILIFY) 15 MG tablet Take 15 mg by mouth every morning.    . hydrOXYzine (ATARAX/VISTARIL) 25 MG tablet Take 1 tablet (25 mg total) by mouth every 6 (six) hours as needed for anxiety. (Patient taking differently: Take 50 mg by mouth 4 (four) times daily as needed for anxiety. ) 60 tablet 0   . lithium carbonate (ESKALITH) 450 MG CR tablet Take 450 mg by mouth 2 (two) times daily.    . meloxicam (MOBIC) 15 MG tablet Take 15 mg by mouth daily.    . predniSONE (STERAPRED UNI-PAK 21 TAB) 10 MG (21) TBPK tablet Take 6 tabs for 2 days, then 5 for 2 days, then 4 for 2 days, then 3 for 2 days, 2 for 2 days, then 1 for 2 days (Patient not taking: Reported on 03/09/2020) 42 tablet 0    Results for orders placed or performed during the hospital encounter of 03/20/20 (from the past 48 hour(s))  Pregnancy, urine POC     Status: None   Collection Time: 03/20/20  7:13 AM  Result Value Ref Range   Preg Test, Ur NEGATIVE NEGATIVE    Comment:        THE SENSITIVITY OF THIS METHODOLOGY IS >24 mIU/mL    No results found.  Pertinent items noted in HPI and remainder of comprehensive ROS otherwise negative.  Blood pressure 127/77, pulse 89, temperature 98.2 F (36.8 C), temperature source Oral, resp. rate 18, height 5\' 3"  (1.6 m), weight 89.4 kg, SpO2 100 %.  Patient is awake and alert.  She is oriented and appropriate.  Speech is fluent.  Judgment insight are intact.  Cranial nerve function normal bilateral.  Motor examination reveals weakness of her left-sided brachioradialis grading at 4/5 and left-sided wrist extensors grading out of 4/5.  Otherwise  motor strength is intact.  Sensory examination with decrease sensation pinprick light touch in her left C6 dermatome.  Deep tender versus normal active.  No evidence of long track signs.  Gait and posture reasonably normal.  Examination head ears eyes nose and throat is unremarkable her chest and abdomen benign.  Extremities are free from injury deformity. Assessment/Plan Left C5-6 herniated nucleus pulposus with radiculopathy.  Plan C5-6 anterior cervical discectomy and fusion with instrumentation.  Risks and benefits of been explained.  Patient wishes to proceed.  Mallie Mussel A Burlon Centrella 03/20/2020, 8:00 AM

## 2020-03-20 NOTE — Progress Notes (Signed)
Patient is discharged from room 3C10 at this time. Alert and in stable condition. IV site d/c'd and instructions read to patient and spouse with understanding verbalized and all questions answered. Left unit via wheelchair with all belongings at side. 

## 2020-03-20 NOTE — Progress Notes (Signed)
Orthopedic Tech Progress Note Patient Details:  Jill Cardenas 07-14-1986 [id number] RN said patient has collar Patient ID: Jill Cardenas, female   DOB: June 22, 1986, 34 y.o.   MRN: [id number]   [name] 03/20/2020, 12:14 PM

## 2020-03-20 NOTE — Anesthesia Procedure Notes (Signed)
Procedure Name: Intubation Date/Time: 03/20/2020 8:19 AM Performed by: Waynard Edwards, CRNA Pre-anesthesia Checklist: Patient identified, Emergency Drugs available, Suction available and Patient being monitored Patient Re-evaluated:Patient Re-evaluated prior to induction Oxygen Delivery Method: Circle system utilized Preoxygenation: Pre-oxygenation with 100% oxygen Induction Type: IV induction Ventilation: Mask ventilation without difficulty Laryngoscope Size: Miller and 2 Grade View: Grade I Tube type: Oral Tube size: 7.0 mm Number of attempts: 1 Airway Equipment and Method: Stylet Placement Confirmation: ETT inserted through vocal cords under direct vision,  positive ETCO2 and breath sounds checked- equal and bilateral Secured at: 21 cm Tube secured with: Tape Dental Injury: Teeth and Oropharynx as per pre-operative assessment

## 2020-03-20 NOTE — Discharge Instructions (Signed)

## 2020-03-21 NOTE — Anesthesia Postprocedure Evaluation (Signed)
Anesthesia Post Note  Patient: Jill Cardenas  Procedure(s) Performed: Anterior Cervical Decompression Discectomy Fusion - Cervical five-six (N/A Neck)     Patient location during evaluation: PACU Anesthesia Type: General Level of consciousness: awake and alert Pain management: pain level controlled Vital Signs Assessment: post-procedure vital signs reviewed and stable Respiratory status: spontaneous breathing, nonlabored ventilation, respiratory function stable and patient connected to nasal cannula oxygen Cardiovascular status: blood pressure returned to baseline and stable Postop Assessment: no apparent nausea or vomiting Anesthetic complications: no    Last Vitals:  Vitals:   03/20/20 1059 03/20/20 1115  BP:  122/78  Pulse: 79 88  Resp: 19 18  Temp: 36.7 C 36.8 C  SpO2: 96% 100%    Last Pain:  Vitals:   03/20/20 1330  TempSrc:   PainSc: 6                  Bernd Crom

## 2020-03-23 ENCOUNTER — Encounter: Payer: Self-pay | Admitting: *Deleted

## 2020-05-26 ENCOUNTER — Other Ambulatory Visit: Payer: Self-pay

## 2020-05-26 ENCOUNTER — Emergency Department (HOSPITAL_COMMUNITY)
Admission: EM | Admit: 2020-05-26 | Discharge: 2020-05-26 | Disposition: A | Discharge status: Home / Self Care | Payer: Medicaid Other | Attending: Emergency Medicine | Admitting: Emergency Medicine

## 2020-05-26 ENCOUNTER — Encounter (HOSPITAL_COMMUNITY): Payer: Self-pay | Admitting: *Deleted

## 2020-05-26 DIAGNOSIS — R1084 Generalized abdominal pain: Secondary | ICD-10-CM | POA: Insufficient documentation

## 2020-05-26 DIAGNOSIS — Z5321 Procedure and treatment not carried out due to patient leaving prior to being seen by health care provider: Secondary | ICD-10-CM | POA: Diagnosis not present

## 2020-05-26 LAB — URINALYSIS, ROUTINE W REFLEX MICROSCOPIC
Bilirubin Urine: NEGATIVE
Glucose, UA: NEGATIVE mg/dL
Ketones, ur: NEGATIVE mg/dL
Leukocytes,Ua: NEGATIVE
Nitrite: NEGATIVE
Protein, ur: NEGATIVE mg/dL
Specific Gravity, Urine: 1.017 (ref 1.005–1.030)
pH: 5 (ref 5.0–8.0)

## 2020-05-26 LAB — CBC
HCT: 43.5 % (ref 36.0–46.0)
Hemoglobin: 14.1 g/dL (ref 12.0–15.0)
MCH: 30.3 pg (ref 26.0–34.0)
MCHC: 32.4 g/dL (ref 30.0–36.0)
MCV: 93.5 fL (ref 80.0–100.0)
Platelets: 432 10*3/uL — ABNORMAL HIGH (ref 150–400)
RBC: 4.65 MIL/uL (ref 3.87–5.11)
RDW: 13.4 % (ref 11.5–15.5)
WBC: 22.3 10*3/uL — ABNORMAL HIGH (ref 4.0–10.5)
nRBC: 0 % (ref 0.0–0.2)

## 2020-05-26 LAB — COMPREHENSIVE METABOLIC PANEL
ALT: 12 U/L (ref 0–44)
AST: 14 U/L — ABNORMAL LOW (ref 15–41)
Albumin: 3.8 g/dL (ref 3.5–5.0)
Alkaline Phosphatase: 63 U/L (ref 38–126)
Anion gap: 12 (ref 5–15)
BUN: 10 mg/dL (ref 6–20)
CO2: 21 mmol/L — ABNORMAL LOW (ref 22–32)
Calcium: 9.7 mg/dL (ref 8.9–10.3)
Chloride: 104 mmol/L (ref 98–111)
Creatinine, Ser: 0.7 mg/dL (ref 0.44–1.00)
GFR calc Af Amer: >60 mL/min (ref >60)
GFR calc non Af Amer: >60 mL/min (ref >60)
Glucose, Bld: 105 mg/dL — ABNORMAL HIGH (ref 70–99)
Potassium: 3.7 mmol/L (ref 3.5–5.1)
Sodium: 137 mmol/L (ref 135–145)
Total Bilirubin: 0.4 mg/dL (ref 0.3–1.2)
Total Protein: 6.5 g/dL (ref 6.5–8.1)

## 2020-05-26 LAB — LIPASE, BLOOD: Lipase: 31 U/L (ref 11–51)

## 2020-05-26 LAB — I-STAT BETA HCG BLOOD, ED (MC, WL, AP ONLY): I-stat hCG, quantitative: <5 m[IU]/mL (ref <5)

## 2020-05-26 MED ORDER — SODIUM CHLORIDE 0.9% FLUSH: 3.0000 mL | Freq: Once | INTRAVENOUS | Status: DC

## 2020-05-26 NOTE — ED Triage Notes (Signed)
Pt with generalized abdominal pain, bloating, back pain since Friday. LBM Tuesday, has drank magnesium citrate without relief.

## 2020-05-26 NOTE — ED Notes (Signed)
Pts visitor came in asking where the pt was and if she had been moved back yet, this NT explained that she was still in the waiting area and she had only been here for an hour. Visitor then stated "this is a hospital right? This is just ridiculous", pt then approached this NT and stated she was leaving.

## 2021-03-10 ENCOUNTER — Other Ambulatory Visit: Payer: Self-pay | Admitting: Neurosurgery

## 2021-03-10 DIAGNOSIS — Z981 Arthrodesis status: Secondary | ICD-10-CM

## 2021-03-31 ENCOUNTER — Other Ambulatory Visit: Payer: Medicaid Other

## 2021-04-14 ENCOUNTER — Ambulatory Visit
Admission: RE | Admit: 2021-04-14 | Discharge: 2021-04-14 | Disposition: A | Discharge status: Home / Self Care | Payer: Medicaid Other | Source: Ambulatory Visit | Attending: Neurosurgery | Admitting: Neurosurgery

## 2021-04-14 ENCOUNTER — Other Ambulatory Visit: Payer: Self-pay

## 2021-04-14 DIAGNOSIS — Z981 Arthrodesis status: Secondary | ICD-10-CM

## 2021-10-01 IMAGING — CT CT CERVICAL SPINE W/O CM
2 series · 10 of 14 positions shown, 12 images · non-contrast
Comparison: Operative images 03/20/2021. CT 01/03/2019. MRI
12/18/2019.

CLINICAL DATA: Recent ACDF with persistent left shoulder pain.

EXAM:
CT CERVICAL SPINE WITHOUT CONTRAST
TECHNIQUE: Multidetector CT imaging of the cervical spine was performed without
intravenous contrast. Multiplanar CT image reconstructions were also
generated.

[Series 2: cspine soft (person_name) · axial · 0.22mm/px · z∈[-240,-110]mm · 5 of 99 slices shown, 7 images]
[im 17/99  soft-tissue]
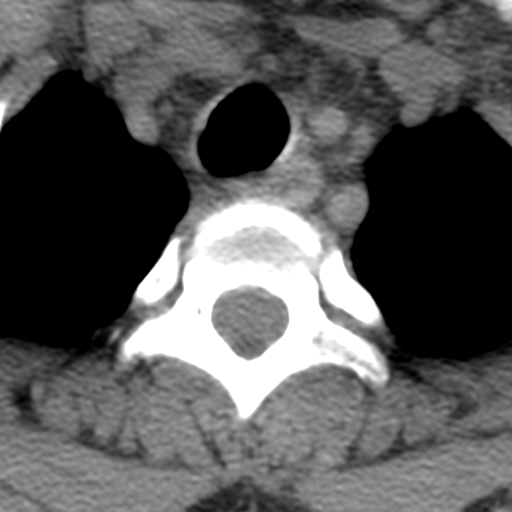
[im 17/99  bone]
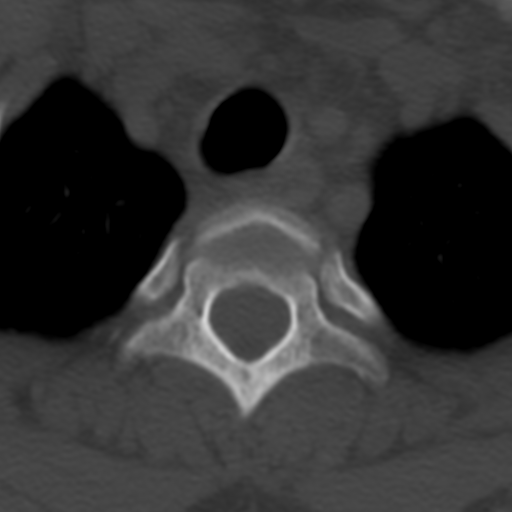
[im 33/99  bone]
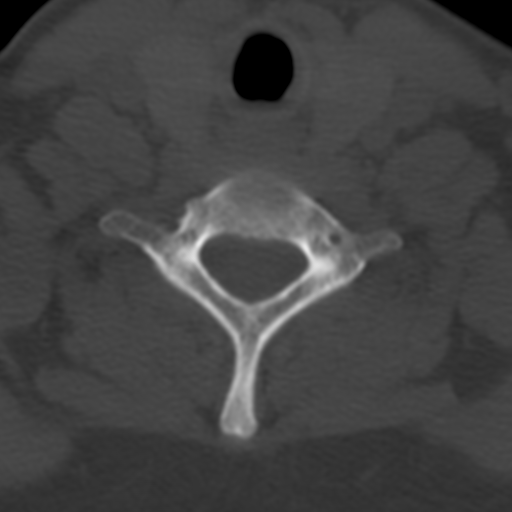
[im 50/99  bone]
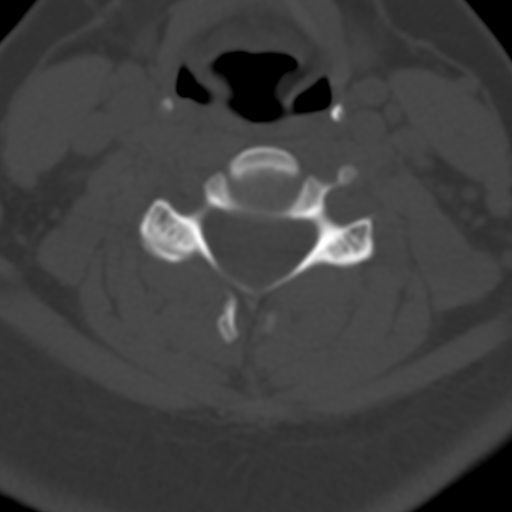
[im 66/99  bone]
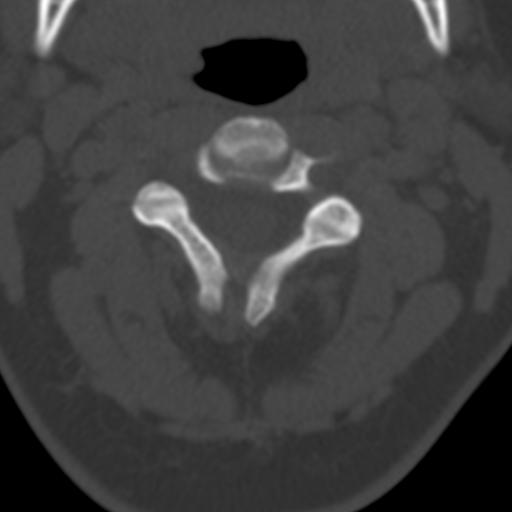
[im 82/99  soft-tissue]
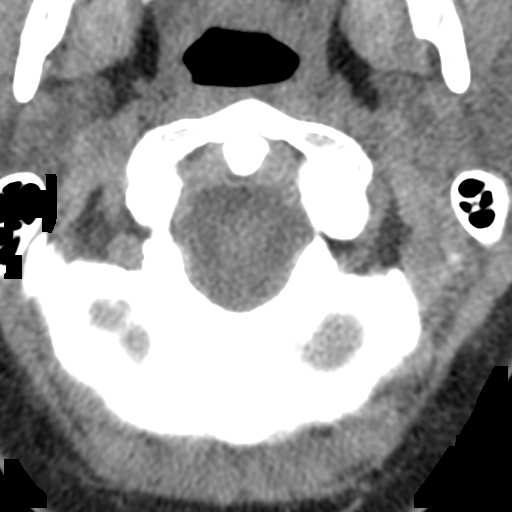
[im 82/99  bone]
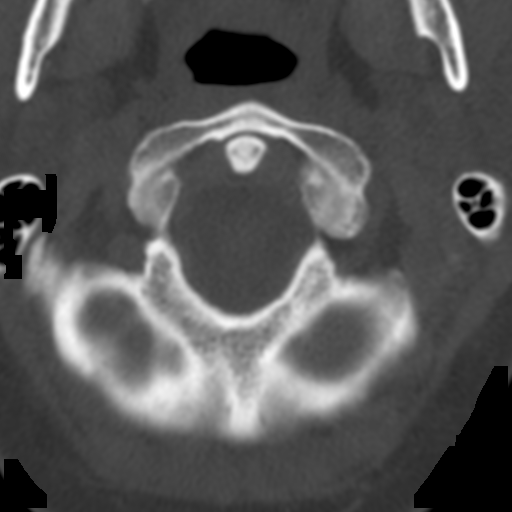

[Series 8: angled axial soft · axial · 0.29mm/px · z∈[-254,-124]mm · 5 of 100 slices shown]
[im 17/100  soft-tissue]
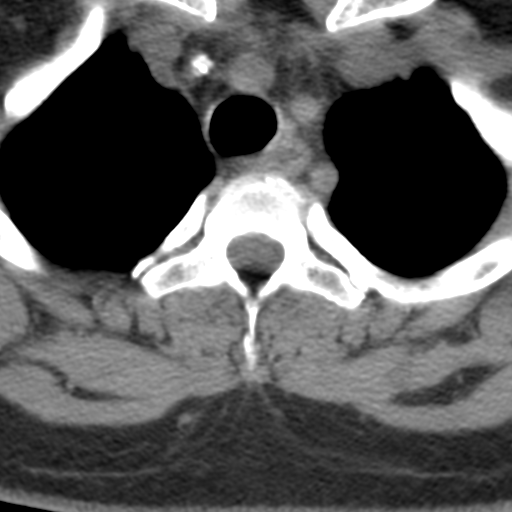
[im 34/100  soft-tissue]
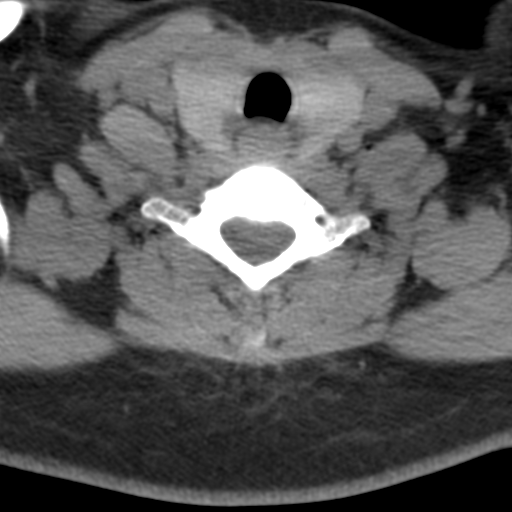
[im 50/100  soft-tissue]
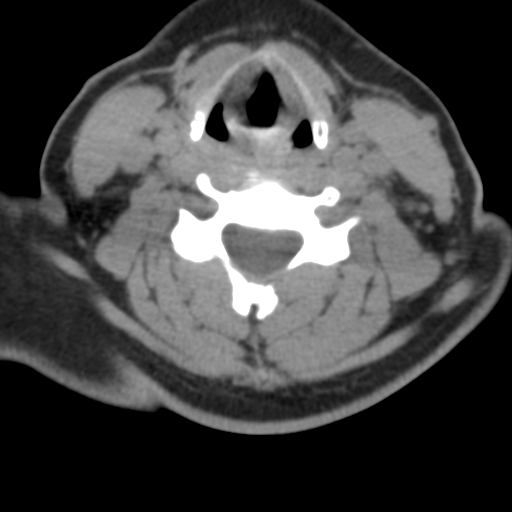
[im 67/100  soft-tissue]
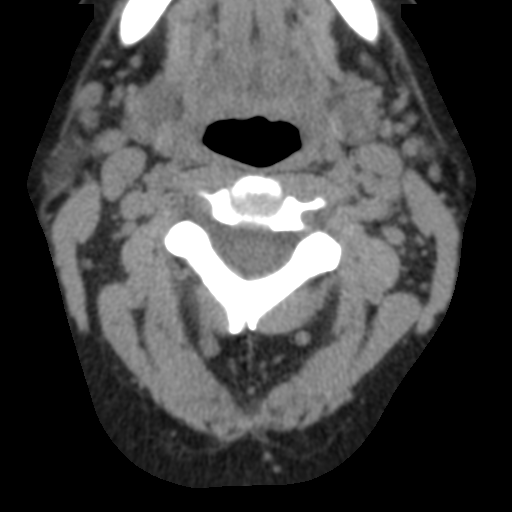
[im 83/100  soft-tissue]
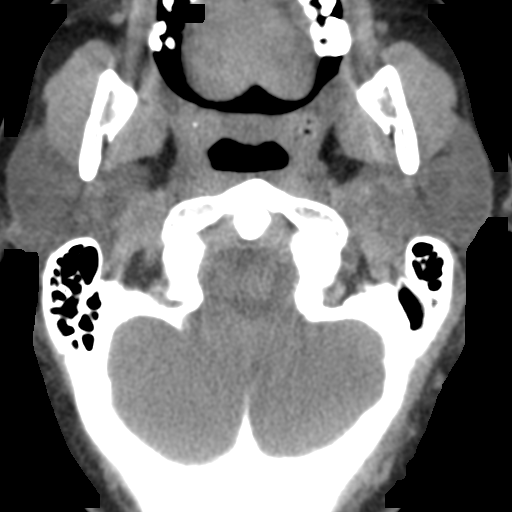

[10 of 14 positions shown; findings below may reference images not displayed]

FINDINGS: Alignment: Normal

Skull base and vertebrae: Recent ACDF C5-6 has a good appearance.
Components well position without unexpected finding or visible
complication.

Soft tissues and spinal canal: No unexpected soft tissue finding. No
complication seen along the surgical approach.

Disc levels: No abnormality from the foramen magnum through C3-4.
C4-5 shows minimal uncovertebral prominence but no apparent stenosis
of the canal or foramina. C6-7 and C7-T1 appear normal.

Upper chest: Negative

Other: None
IMPRESSION: Good appearance following ACDF at C5-6. Components appear well
positioned. No complicating feature is evident. The remainder of the
study is essentially negative.

## 2023-01-20 DIAGNOSIS — M5124 Other intervertebral disc displacement, thoracic region: Secondary | ICD-10-CM

## 2023-01-20 HISTORY — DX: Other intervertebral disc displacement, thoracic region: M51.24

## 2023-03-21 ENCOUNTER — Other Ambulatory Visit: Payer: Self-pay | Admitting: Student

## 2023-03-21 DIAGNOSIS — M5124 Other intervertebral disc displacement, thoracic region: Secondary | ICD-10-CM

## 2023-04-03 ENCOUNTER — Ambulatory Visit
Admission: RE | Admit: 2023-04-03 | Discharge: 2023-04-03 | Disposition: A | Discharge status: Home / Self Care | Payer: Medicaid Other | Source: Ambulatory Visit | Attending: Student | Admitting: Student

## 2023-04-03 DIAGNOSIS — M5124 Other intervertebral disc displacement, thoracic region: Secondary | ICD-10-CM

## 2023-04-13 ENCOUNTER — Other Ambulatory Visit: Payer: Self-pay | Admitting: Neurosurgery

## 2023-04-18 NOTE — Progress Notes (Addendum)
Surgical Instructions    Your procedure is scheduled on Monday June 3rd.  Report to [facility] Main Entrance "A" at 11:30 A.M., then check in with the Admitting office.  Call this number if you have problems the morning of surgery:  [phone]   If you have any questions prior to your surgery date call [phone]: Open Monday-Friday 8am-4pm If you experience any cold or flu symptoms such as cough, fever, chills, shortness of breath, etc. between now and your scheduled surgery, please notify us at the above number     Remember:  Do not eat or drink after midnight the night before your surgery      Take these medicines the morning of surgery with A SIP OF WATER: gabapentin (NEURONTIN) 100 MG capsule  methocarbamol (ROBAXIN) 500 MG tablet   IF NEEDED  acetaminophen (TYLENOL) 500 MG tablet  hydrOXYzine (ATARAX/VISTARIL) 25 MG tablet      As of today, STOP taking any Aspirin (unless otherwise instructed by your surgeon) Aleve, Naproxen, Ibuprofen, Motrin, Advil, Goody's, BC's, all herbal medications, fish oil, and all vitamins.       SURGICAL INSTRUCTIONS     Do not wear jewelry or makeup. Do not wear lotions, powders, perfumes or deodorant. Do not shave 48 hours prior to surgery.   Do not bring valuables to the hospital. Do not wear nail polish, gel polish, artificial nails, or any other type of covering on natural nails (fingers and toes) If you have artificial nails or gel coating that need to be removed by a nail salon, please have this removed prior to surgery. Artificial nails or gel coating may interfere with anesthesia's ability to adequately monitor your vital signs.  [facility] is not responsible for any belongings or valuables.    Do NOT Smoke (Tobacco/Vaping)  24 hours prior to your procedure  If you use a CPAP at night, you may bring your mask for your overnight stay.   Contacts, glasses, hearing aids, dentures or partials may not be worn into surgery,  please bring cases for these belongings   For patients admitted to the hospital, discharge time will be determined by your treatment team.   Patients discharged the day of surgery will not be allowed to drive home, and someone needs to stay with them for 24 hours.   SURGICAL WAITING ROOM VISITATION Patients having surgery or a procedure may have no more than 2 support people in the waiting area - these visitors may rotate.   Children under the age of 53 must have an adult with them who is not the patient. If the patient needs to stay at the hospital during part of their recovery, the visitor guidelines for inpatient rooms apply. Pre-op nurse will coordinate an appropriate time for 1 support person to accompany patient in pre-op.  This support person may not rotate.   Please refer to [email or web address] for the visitor guidelines for Inpatients (after your surgery is over and you are in a regular room).    Special instructions:    Oral Hygiene is also important to reduce your risk of infection.  Remember - BRUSH YOUR TEETH THE MORNING OF SURGERY WITH YOUR REGULAR TOOTHPASTE   [place]- Preparing For Surgery  Before surgery, you can play an important role. Because skin is not sterile, your skin needs to be as free of germs as possible. You can reduce the number of germs on your skin by washing with CHG (chlorahexidine gluconate) Soap before surgery.  CHG is  an antiseptic cleaner which kills germs and bonds with the skin to continue killing germs even after washing.     Please do not use if you have an allergy to CHG or antibacterial soaps. If your skin becomes reddened/irritated stop using the CHG.  Do not shave (including legs and underarms) for at least 48 hours prior to first CHG shower. It is OK to shave your face.  Please follow these instructions carefully.      Pre-operative 5 CHG Bath Instructions   You can play a key role  in reducing the risk of infection after surgery. Your skin needs to be as free of germs as possible. You can reduce the number of germs on your skin by washing with CHG (chlorhexidine gluconate) soap before surgery. CHG is an antiseptic soap that kills germs and continues to kill germs even after washing.   DO NOT use if you have an allergy to chlorhexidine/CHG or antibacterial soaps. If your skin becomes reddened or irritated, stop using the CHG and notify one of our RNs at [phone].   Please shower with the CHG soap starting 4 days before surgery using the following schedule:     Please keep in mind the following:  DO NOT shave, including legs and underarms, starting the day of your first shower.   You may shave your face at any point before/day of surgery.  Place clean sheets on your bed the day you start using CHG soap. Use a clean washcloth (not used since being washed) for each shower. DO NOT sleep with pets once you start using the CHG.   CHG Shower Instructions:  If you choose to wash your hair and private area, wash first with your normal shampoo/soap.  After you use shampoo/soap, rinse your hair and body thoroughly to remove shampoo/soap residue.  Turn the water OFF and apply about 3 tablespoons (45 ml) of CHG soap to a CLEAN washcloth.  Apply CHG soap ONLY FROM YOUR NECK DOWN TO YOUR TOES (washing for 3-5 minutes)  DO NOT use CHG soap on face, private areas, open wounds, or sores.  Pay special attention to the area where your surgery is being performed.  If you are having back surgery, having someone wash your back for you may be helpful. Wait 2 minutes after CHG soap is applied, then you may rinse off the CHG soap.  Pat dry with a clean towel  Put on clean clothes/pajamas   If you choose to wear lotion, please use ONLY the CHG-compatible lotions on the back of this paper.     Additional instructions for the day of surgery: DO NOT APPLY any lotions, deodorants, cologne, or  perfumes.   Put on clean/comfortable clothes.  Brush your teeth.  Ask your nurse before applying any prescription medications to the skin.      CHG Compatible Lotions   Aveeno Moisturizing lotion  Cetaphil Moisturizing Cream  Cetaphil Moisturizing Lotion  Clairol Herbal Essence Moisturizing Lotion, Dry Skin  Clairol Herbal Essence Moisturizing Lotion, Extra Dry Skin  Clairol Herbal Essence Moisturizing Lotion, Normal Skin  Curel Age Defying Therapeutic Moisturizing Lotion with Alpha Hydroxy  Curel Extreme Care Body Lotion  Curel Soothing Hands Moisturizing Hand Lotion  Curel Therapeutic Moisturizing Cream, Fragrance-Free  Curel Therapeutic Moisturizing Lotion, Fragrance-Free  Curel Therapeutic Moisturizing Lotion, Original Formula  Eucerin Daily Replenishing Lotion  Eucerin Dry Skin Therapy Plus Alpha Hydroxy Crme  Eucerin Dry Skin Therapy Plus Alpha Hydroxy Lotion  Eucerin Original Crme  Eucerin Original  Lotion  Eucerin Plus Crme Eucerin Plus Lotion  Eucerin TriLipid Replenishing Lotion  Keri Anti-Bacterial Hand Lotion  Keri Deep Conditioning Original Lotion Dry Skin Formula Softly Scented  Keri Deep Conditioning Original Lotion, Fragrance Free Sensitive Skin Formula  Keri Lotion Fast Absorbing Fragrance Free Sensitive Skin Formula  Keri Lotion Fast Absorbing Softly Scented Dry Skin Formula  Keri Original Lotion  Keri Skin Renewal Lotion Keri Silky Smooth Lotion  Keri Silky Smooth Sensitive Skin Lotion  Nivea Body Creamy Conditioning Oil  Nivea Body Extra Enriched Teacher, adult education Moisturizing Lotion Nivea Crme  Nivea Skin Firming Lotion  NutraDerm 30 Skin Lotion  NutraDerm Skin Lotion  NutraDerm Therapeutic Skin Cream  NutraDerm Therapeutic Skin Lotion  ProShield Protective Hand Cream  Provon moisturizing lotion   Day of Surgery:  Take a shower with CHG soap. Wear Clean/Comfortable clothing the morning of surgery Do not  apply any deodorants/lotions.   Remember to brush your teeth WITH YOUR REGULAR TOOTHPASTE.    If you received a COVID test during your pre-op visit, it is requested that you wear a mask when out in public, stay away from anyone that may not be feeling well, and notify your surgeon if you develop symptoms. If you have been in contact with anyone that has tested positive in the last 10 days, please notify your surgeon.    Please read over the following fact sheets that you were given.

## 2023-04-19 ENCOUNTER — Encounter (HOSPITAL_COMMUNITY)
Admission: RE | Admit: 2023-04-19 | Discharge: 2023-04-19 | Disposition: A | Discharge status: Home / Self Care | Payer: Medicaid Other | Source: Ambulatory Visit | Attending: Neurosurgery | Admitting: Neurosurgery

## 2023-04-19 ENCOUNTER — Encounter (HOSPITAL_COMMUNITY): Payer: Self-pay | Admitting: *Deleted

## 2023-04-19 ENCOUNTER — Other Ambulatory Visit: Payer: Self-pay

## 2023-04-19 VITALS — BP 121/78 | HR 92 | Temp 98.7°F | Resp 17 | Ht 63.0 in | Wt 204.0 lb

## 2023-04-19 DIAGNOSIS — Z01812 Encounter for preprocedural laboratory examination: Secondary | ICD-10-CM | POA: Insufficient documentation

## 2023-04-19 DIAGNOSIS — Z01818 Encounter for other preprocedural examination: Secondary | ICD-10-CM

## 2023-04-19 LAB — BASIC METABOLIC PANEL
Anion gap: 10 (ref 5–15)
BUN: 16 mg/dL (ref 6–20)
CO2: 25 mmol/L (ref 22–32)
Calcium: 10 mg/dL (ref 8.9–10.3)
Chloride: 103 mmol/L (ref 98–111)
Creatinine, Ser: 0.7 mg/dL (ref 0.44–1.00)
GFR, Estimated: >60 mL/min (ref >60)
Glucose, Bld: 85 mg/dL (ref 70–99)
Potassium: 4.3 mmol/L (ref 3.5–5.1)
Sodium: 138 mmol/L (ref 135–145)

## 2023-04-19 LAB — SURGICAL PCR SCREEN
MRSA, PCR: NEGATIVE
Staphylococcus aureus: POSITIVE — AB

## 2023-04-19 LAB — CBC
HCT: 44.4 % (ref 36.0–46.0)
Hemoglobin: 14.7 g/dL (ref 12.0–15.0)
MCH: 30 pg (ref 26.0–34.0)
MCHC: 33.1 g/dL (ref 30.0–36.0)
MCV: 90.6 fL (ref 80.0–100.0)
Platelets: 377 10*3/uL (ref 150–400)
RBC: 4.9 MIL/uL (ref 3.87–5.11)
RDW: 11.9 % (ref 11.5–15.5)
WBC: 10.4 10*3/uL (ref 4.0–10.5)
nRBC: 0 % (ref 0.0–0.2)

## 2023-04-19 NOTE — Progress Notes (Signed)
PCP - Mauricio Po at [facility] Cardiologist - Denies  PPM/ICD - Denies  Chest x-ray - N/A EKG - N/A Stress Test - Denies ECHO - Denies Cardiac Cath - Denies  Sleep Study - Denies  DM - Denies  Blood Thinner Instructions:N/A Aspirin Instructions:N/A  ERAS Protcol - No  COVID TEST- N/A   Anesthesia review: No  Patient denies shortness of breath, fever, cough and chest pain at PAT appointment   All instructions explained to the patient, with a verbal understanding of the material. Patient agrees to go over the instructions while at home for a better understanding. The opportunity to ask questions was provided.

## 2023-04-24 ENCOUNTER — Other Ambulatory Visit: Payer: Self-pay

## 2023-04-24 ENCOUNTER — Ambulatory Visit (HOSPITAL_COMMUNITY): Payer: Medicaid Other

## 2023-04-24 ENCOUNTER — Encounter (HOSPITAL_COMMUNITY): Payer: Self-pay | Admitting: Neurosurgery

## 2023-04-24 ENCOUNTER — Encounter (HOSPITAL_COMMUNITY)
Admission: RE | Disposition: A | Discharge status: Home / Self Care | Payer: Self-pay | Source: Home / Self Care | Attending: Neurosurgery

## 2023-04-24 ENCOUNTER — Observation Stay (HOSPITAL_COMMUNITY)
Admission: RE | Admit: 2023-04-24 | Discharge: 2023-04-25 | Disposition: A | Discharge status: Home / Self Care | Payer: Medicaid Other | Attending: Neurosurgery | Admitting: Neurosurgery

## 2023-04-24 ENCOUNTER — Ambulatory Visit (HOSPITAL_BASED_OUTPATIENT_CLINIC_OR_DEPARTMENT_OTHER): Payer: Medicaid Other

## 2023-04-24 DIAGNOSIS — Z79899 Other long term (current) drug therapy: Secondary | ICD-10-CM | POA: Diagnosis not present

## 2023-04-24 DIAGNOSIS — M5104 Intervertebral disc disorders with myelopathy, thoracic region: Principal | ICD-10-CM | POA: Diagnosis present

## 2023-04-24 DIAGNOSIS — Z91040 Latex allergy status: Secondary | ICD-10-CM | POA: Diagnosis not present

## 2023-04-24 DIAGNOSIS — Z87891 Personal history of nicotine dependence: Secondary | ICD-10-CM | POA: Diagnosis not present

## 2023-04-24 DIAGNOSIS — F418 Other specified anxiety disorders: Secondary | ICD-10-CM | POA: Diagnosis not present

## 2023-04-24 DIAGNOSIS — M5124 Other intervertebral disc displacement, thoracic region: Secondary | ICD-10-CM | POA: Diagnosis not present

## 2023-04-24 DIAGNOSIS — Z6835 Body mass index (BMI) 35.0-35.9, adult: Secondary | ICD-10-CM | POA: Diagnosis not present

## 2023-04-24 HISTORY — PX: THORACIC DISCECTOMY: SHX6113

## 2023-04-24 LAB — POCT PREGNANCY, URINE: Preg Test, Ur: NEGATIVE

## 2023-04-24 SURGERY — THORACIC DISCECTOMY
Anesthesia: General | Site: Back | Laterality: Right

## 2023-04-24 MED ORDER — BUPIVACAINE HCL (PF) 0.25 % IJ SOLN
INTRAMUSCULAR | Status: DC | PRN
  Administered 2023-04-24: 30 mL

## 2023-04-24 MED ORDER — CHLORHEXIDINE GLUCONATE CLOTH 2 % EX PADS: 6.0000 | MEDICATED_PAD | Freq: Once | CUTANEOUS | Status: DC

## 2023-04-24 MED ORDER — ONDANSETRON HCL 4 MG/2ML IJ SOLN: 4.0000 mg | Freq: Four times a day (QID) | INTRAMUSCULAR | Status: DC | PRN

## 2023-04-24 MED ORDER — ACETAMINOPHEN 500 MG PO TABS: 1000.0000 mg | ORAL_TABLET | Freq: Once | ORAL | Status: DC

## 2023-04-24 MED ORDER — SUGAMMADEX SODIUM 200 MG/2ML IV SOLN
INTRAVENOUS | Status: DC | PRN
  Administered 2023-04-24: 200 mg via INTRAVENOUS

## 2023-04-24 MED ORDER — ROCURONIUM BROMIDE 10 MG/ML (PF) SYRINGE
PREFILLED_SYRINGE | INTRAVENOUS | Status: AC
  Filled 2023-04-24: qty 10

## 2023-04-24 MED ORDER — ONDANSETRON HCL 4 MG PO TABS: 4.0000 mg | ORAL_TABLET | Freq: Four times a day (QID) | ORAL | Status: DC | PRN

## 2023-04-24 MED ORDER — CEFAZOLIN SODIUM-DEXTROSE 2-3 GM-%(50ML) IV SOLR
INTRAVENOUS | Status: DC | PRN
  Administered 2023-04-24: 2 g via INTRAVENOUS

## 2023-04-24 MED ORDER — PHENYLEPHRINE HCL-NACL 20-0.9 MG/250ML-% IV SOLN
INTRAVENOUS | Status: AC
  Filled 2023-04-24: qty 250

## 2023-04-24 MED ORDER — THROMBIN 5000 UNITS EX SOLR
CUTANEOUS | Status: AC
  Filled 2023-04-24: qty 5000

## 2023-04-24 MED ORDER — KETOROLAC TROMETHAMINE 15 MG/ML IJ SOLN
30.0000 mg | Freq: Four times a day (QID) | INTRAMUSCULAR | Status: DC
  Administered 2023-04-24 – 2023-04-25 (×3): 30 mg via INTRAVENOUS
  Filled 2023-04-24 (×4): qty 2

## 2023-04-24 MED ORDER — ESMOLOL HCL 100 MG/10ML IV SOLN
INTRAVENOUS | Status: AC
  Filled 2023-04-24: qty 10

## 2023-04-24 MED ORDER — KETOROLAC TROMETHAMINE 15 MG/ML IJ SOLN
INTRAMUSCULAR | Status: DC | PRN
  Administered 2023-04-24: 30 mg via INTRAVENOUS

## 2023-04-24 MED ORDER — AMISULPRIDE (ANTIEMETIC) 5 MG/2ML IV SOLN: 10.0000 mg | Freq: Once | INTRAVENOUS | Status: DC | PRN

## 2023-04-24 MED ORDER — PHENYLEPHRINE 80 MCG/ML (10ML) SYRINGE FOR IV PUSH (FOR BLOOD PRESSURE SUPPORT)
PREFILLED_SYRINGE | INTRAVENOUS | Status: DC | PRN
  Administered 2023-04-24: 160 ug via INTRAVENOUS
  Administered 2023-04-24: 80 ug via INTRAVENOUS
  Administered 2023-04-24: 160 ug via INTRAVENOUS

## 2023-04-24 MED ORDER — HYDROCODONE-ACETAMINOPHEN 5-325 MG PO TABS: 1.0000 | ORAL_TABLET | ORAL | Status: DC | PRN

## 2023-04-24 MED ORDER — ROCURONIUM BROMIDE 10 MG/ML (PF) SYRINGE
PREFILLED_SYRINGE | INTRAVENOUS | Status: DC | PRN
  Administered 2023-04-24: 10 mg via INTRAVENOUS
  Administered 2023-04-24: 70 mg via INTRAVENOUS
  Administered 2023-04-24: 10 mg via INTRAVENOUS

## 2023-04-24 MED ORDER — ACETAMINOPHEN 650 MG RE SUPP: 650.0000 mg | RECTAL | Status: DC | PRN

## 2023-04-24 MED ORDER — HYDROXYZINE HCL 25 MG PO TABS
50.0000 mg | ORAL_TABLET | Freq: Three times a day (TID) | ORAL | Status: DC
  Administered 2023-04-24 – 2023-04-25 (×3): 50 mg via ORAL
  Filled 2023-04-24 (×3): qty 2

## 2023-04-24 MED ORDER — HYDROMORPHONE HCL 1 MG/ML IJ SOLN
INTRAMUSCULAR | Status: AC
  Filled 2023-04-24: qty 1

## 2023-04-24 MED ORDER — PHENYLEPHRINE HCL-NACL 20-0.9 MG/250ML-% IV SOLN
INTRAVENOUS | Status: DC | PRN
  Administered 2023-04-24: 15 ug/min via INTRAVENOUS

## 2023-04-24 MED ORDER — PROPOFOL 10 MG/ML IV BOLUS
INTRAVENOUS | Status: AC
  Filled 2023-04-24: qty 20

## 2023-04-24 MED ORDER — SODIUM CHLORIDE 0.9% FLUSH: 3.0000 mL | Freq: Two times a day (BID) | INTRAVENOUS | Status: DC

## 2023-04-24 MED ORDER — LIDOCAINE 2% (20 MG/ML) 5 ML SYRINGE
INTRAMUSCULAR | Status: AC
  Filled 2023-04-24: qty 5

## 2023-04-24 MED ORDER — PHENOL 1.4 % MT LIQD: 1.0000 | OROMUCOSAL | Status: DC | PRN

## 2023-04-24 MED ORDER — CEFAZOLIN SODIUM-DEXTROSE 2-4 GM/100ML-% IV SOLN
2.0000 g | INTRAVENOUS | Status: DC
  Filled 2023-04-24: qty 100

## 2023-04-24 MED ORDER — BACITRACIN ZINC 500 UNIT/GM EX OINT
TOPICAL_OINTMENT | CUTANEOUS | Status: DC | PRN
  Administered 2023-04-24: 1 via TOPICAL

## 2023-04-24 MED ORDER — HYDROMORPHONE HCL 1 MG/ML IJ SOLN: 1.0000 mg | INTRAMUSCULAR | Status: DC | PRN

## 2023-04-24 MED ORDER — MIDAZOLAM HCL 2 MG/2ML IJ SOLN
INTRAMUSCULAR | Status: DC | PRN
  Administered 2023-04-24: 2 mg via INTRAVENOUS

## 2023-04-24 MED ORDER — SODIUM CHLORIDE 0.9 % IV SOLN
250.0000 mL | INTRAVENOUS | Status: DC
  Administered 2023-04-24: 250 mL via INTRAVENOUS

## 2023-04-24 MED ORDER — DEXAMETHASONE SODIUM PHOSPHATE 10 MG/ML IJ SOLN
INTRAMUSCULAR | Status: DC | PRN
  Administered 2023-04-24: 10 mg via INTRAVENOUS

## 2023-04-24 MED ORDER — KETOROLAC TROMETHAMINE 30 MG/ML IJ SOLN
INTRAMUSCULAR | Status: AC
  Filled 2023-04-24: qty 1

## 2023-04-24 MED ORDER — SODIUM CHLORIDE 0.9% FLUSH: 3.0000 mL | INTRAVENOUS | Status: DC | PRN

## 2023-04-24 MED ORDER — THROMBIN 20000 UNITS EX SOLR
CUTANEOUS | Status: DC | PRN
  Administered 2023-04-24: 20 mL via TOPICAL

## 2023-04-24 MED ORDER — HYDROMORPHONE HCL 1 MG/ML IJ SOLN
INTRAMUSCULAR | Status: AC
  Filled 2023-04-24: qty 0.5

## 2023-04-24 MED ORDER — HYDROMORPHONE HCL 1 MG/ML IJ SOLN
0.2500 mg | INTRAMUSCULAR | Status: DC | PRN
  Administered 2023-04-24 (×2): 0.5 mg via INTRAVENOUS

## 2023-04-24 MED ORDER — ESMOLOL HCL 100 MG/10ML IV SOLN
INTRAVENOUS | Status: DC | PRN
  Administered 2023-04-24: 35 mg via INTRAVENOUS

## 2023-04-24 MED ORDER — ONDANSETRON HCL 4 MG/2ML IJ SOLN
INTRAMUSCULAR | Status: AC
  Filled 2023-04-24: qty 2

## 2023-04-24 MED ORDER — THROMBIN 20000 UNITS EX SOLR
CUTANEOUS | Status: AC
  Filled 2023-04-24: qty 20000

## 2023-04-24 MED ORDER — 0.9 % SODIUM CHLORIDE (POUR BTL) OPTIME
TOPICAL | Status: DC | PRN
  Administered 2023-04-24: 1000 mL

## 2023-04-24 MED ORDER — HYDROMORPHONE HCL 1 MG/ML IJ SOLN
INTRAMUSCULAR | Status: DC | PRN
  Administered 2023-04-24: .5 mg via INTRAVENOUS

## 2023-04-24 MED ORDER — LIDOCAINE 2% (20 MG/ML) 5 ML SYRINGE
INTRAMUSCULAR | Status: DC | PRN
  Administered 2023-04-24: 100 mg via INTRAVENOUS

## 2023-04-24 MED ORDER — LACTATED RINGERS IV SOLN: INTRAVENOUS | Status: DC

## 2023-04-24 MED ORDER — BACITRACIN ZINC 500 UNIT/GM EX OINT
TOPICAL_OINTMENT | CUTANEOUS | Status: AC
  Filled 2023-04-24: qty 28.35

## 2023-04-24 MED ORDER — GABAPENTIN 300 MG PO CAPS: 300.0000 mg | ORAL_CAPSULE | Freq: Once | ORAL | Status: DC

## 2023-04-24 MED ORDER — THROMBIN 5000 UNITS EX SOLR
OROMUCOSAL | Status: DC | PRN
  Administered 2023-04-24: 5 mL via TOPICAL

## 2023-04-24 MED ORDER — GABAPENTIN 100 MG PO CAPS
100.0000 mg | ORAL_CAPSULE | Freq: Three times a day (TID) | ORAL | Status: DC
  Administered 2023-04-24 – 2023-04-25 (×2): 100 mg via ORAL
  Filled 2023-04-24 (×2): qty 1

## 2023-04-24 MED ORDER — CEFAZOLIN SODIUM-DEXTROSE 1-4 GM/50ML-% IV SOLN
1.0000 g | Freq: Three times a day (TID) | INTRAVENOUS | Status: AC
  Administered 2023-04-24 – 2023-04-25 (×2): 1 g via INTRAVENOUS
  Filled 2023-04-24 (×3): qty 50

## 2023-04-24 MED ORDER — GABAPENTIN 100 MG PO CAPS
100.0000 mg | ORAL_CAPSULE | Freq: Once | ORAL | Status: AC
  Administered 2023-04-24: 100 mg via ORAL
  Filled 2023-04-24: qty 1

## 2023-04-24 MED ORDER — MENTHOL 3 MG MT LOZG: 1.0000 | LOZENGE | OROMUCOSAL | Status: DC | PRN

## 2023-04-24 MED ORDER — PROPOFOL 10 MG/ML IV BOLUS
INTRAVENOUS | Status: DC | PRN
  Administered 2023-04-24: 100 mg via INTRAVENOUS
  Administered 2023-04-24: 200 mg via INTRAVENOUS

## 2023-04-24 MED ORDER — MIDAZOLAM HCL 2 MG/2ML IJ SOLN
INTRAMUSCULAR | Status: AC
  Filled 2023-04-24: qty 2

## 2023-04-24 MED ORDER — MEPERIDINE HCL 25 MG/ML IJ SOLN: 6.2500 mg | INTRAMUSCULAR | Status: DC | PRN

## 2023-04-24 MED ORDER — HYDROCODONE-ACETAMINOPHEN 10-325 MG PO TABS
1.0000 | ORAL_TABLET | ORAL | Status: DC | PRN
  Administered 2023-04-24 – 2023-04-25 (×4): 1 via ORAL
  Filled 2023-04-24 (×4): qty 1

## 2023-04-24 MED ORDER — FENTANYL CITRATE (PF) 250 MCG/5ML IJ SOLN
INTRAMUSCULAR | Status: AC
  Filled 2023-04-24: qty 5

## 2023-04-24 MED ORDER — PROMETHAZINE HCL 25 MG/ML IJ SOLN: 6.2500 mg | INTRAMUSCULAR | Status: DC | PRN

## 2023-04-24 MED ORDER — FENTANYL CITRATE (PF) 250 MCG/5ML IJ SOLN
INTRAMUSCULAR | Status: DC | PRN
  Administered 2023-04-24 (×2): 50 ug via INTRAVENOUS
  Administered 2023-04-24: 100 ug via INTRAVENOUS
  Administered 2023-04-24: 50 ug via INTRAVENOUS

## 2023-04-24 MED ORDER — CHLORHEXIDINE GLUCONATE 0.12 % MT SOLN
15.0000 mL | Freq: Once | OROMUCOSAL | Status: AC
  Administered 2023-04-24: 15 mL via OROMUCOSAL
  Filled 2023-04-24: qty 15

## 2023-04-24 MED ORDER — KETAMINE HCL 50 MG/5ML IJ SOSY
PREFILLED_SYRINGE | INTRAMUSCULAR | Status: AC
  Filled 2023-04-24: qty 5

## 2023-04-24 MED ORDER — CYCLOBENZAPRINE HCL 10 MG PO TABS
10.0000 mg | ORAL_TABLET | Freq: Three times a day (TID) | ORAL | Status: DC | PRN
  Administered 2023-04-24 – 2023-04-25 (×2): 10 mg via ORAL
  Filled 2023-04-24 (×2): qty 1

## 2023-04-24 MED ORDER — DEXAMETHASONE SODIUM PHOSPHATE 10 MG/ML IJ SOLN
INTRAMUSCULAR | Status: AC
  Filled 2023-04-24: qty 1

## 2023-04-24 MED ORDER — CARIPRAZINE HCL 1.5 MG PO CAPS
6.0000 mg | ORAL_CAPSULE | Freq: Every day | ORAL | Status: DC
  Administered 2023-04-24: 6 mg via ORAL
  Filled 2023-04-24: qty 4
  Filled 2023-04-24: qty 2

## 2023-04-24 MED ORDER — METHOCARBAMOL 500 MG PO TABS
500.0000 mg | ORAL_TABLET | Freq: Two times a day (BID) | ORAL | Status: DC
  Administered 2023-04-24 – 2023-04-25 (×2): 500 mg via ORAL
  Filled 2023-04-24 (×2): qty 1

## 2023-04-24 MED ORDER — ACETAMINOPHEN 325 MG PO TABS: 650.0000 mg | ORAL_TABLET | ORAL | Status: DC | PRN

## 2023-04-24 MED ORDER — ONDANSETRON HCL 4 MG/2ML IJ SOLN
INTRAMUSCULAR | Status: DC | PRN
  Administered 2023-04-24: 4 mg via INTRAVENOUS

## 2023-04-24 MED ORDER — ORAL CARE MOUTH RINSE: 15.0000 mL | Freq: Once | OROMUCOSAL | Status: AC

## 2023-04-24 MED ORDER — TRAZODONE HCL 50 MG PO TABS
50.0000 mg | ORAL_TABLET | Freq: Every day | ORAL | Status: DC
  Administered 2023-04-24: 50 mg via ORAL
  Filled 2023-04-24: qty 1

## 2023-04-24 MED ORDER — PHENYLEPHRINE 80 MCG/ML (10ML) SYRINGE FOR IV PUSH (FOR BLOOD PRESSURE SUPPORT)
PREFILLED_SYRINGE | INTRAVENOUS | Status: AC
  Filled 2023-04-24: qty 10

## 2023-04-24 MED ORDER — BUPIVACAINE HCL (PF) 0.25 % IJ SOLN
INTRAMUSCULAR | Status: AC
  Filled 2023-04-24: qty 30

## 2023-04-24 SURGICAL SUPPLY — 51 items
ADH SKN CLS APL DERMABOND .7 (GAUZE/BANDAGES/DRESSINGS) ×1
APL SKNCLS STERI-STRIP NONHPOA (GAUZE/BANDAGES/DRESSINGS) ×1
BAG COUNTER SPONGE SURGICOUNT (BAG) ×1 IMPLANT
BAG DECANTER FOR FLEXI CONT (MISCELLANEOUS) ×1 IMPLANT
BAG SPNG CNTER NS LX DISP (BAG) ×1
BENZOIN TINCTURE PRP APPL 2/3 (GAUZE/BANDAGES/DRESSINGS) ×1 IMPLANT
BUR CUTTER 7.0 ROUND (BURR) ×1 IMPLANT
BUR MATCHSTICK NEURO 3.0 LAGG (BURR) IMPLANT
CANISTER SUCT 3000ML PPV (MISCELLANEOUS) ×1 IMPLANT
DERMABOND ADVANCED .7 DNX12 (GAUZE/BANDAGES/DRESSINGS) ×1 IMPLANT
DRAPE HALF SHEET 40X57 (DRAPES) IMPLANT
DRAPE LAPAROTOMY 100X72X124 (DRAPES) ×1 IMPLANT
DRAPE MICROSCOPE SLANT 54X150 (MISCELLANEOUS) ×1 IMPLANT
DRAPE SURG 17X23 STRL (DRAPES) ×4 IMPLANT
DRSG OPSITE POSTOP 4X6 (GAUZE/BANDAGES/DRESSINGS) IMPLANT
ELECT REM PT RETURN 9FT ADLT (ELECTROSURGICAL) ×1
ELECTRODE REM PT RTRN 9FT ADLT (ELECTROSURGICAL) ×1 IMPLANT
GAUZE 4X4 16PLY ~~LOC~~+RFID DBL (SPONGE) IMPLANT
GAUZE SPONGE 4X4 12PLY STRL (GAUZE/BANDAGES/DRESSINGS) ×1 IMPLANT
GLOVE BIO SURGEON STRL SZ 6.5 (GLOVE) ×1 IMPLANT
GLOVE BIOGEL PI IND STRL 6.5 (GLOVE) ×1 IMPLANT
GLOVE ECLIPSE 9.0 STRL (GLOVE) ×1 IMPLANT
GLOVE SS PI 9.0 STRL (GLOVE) IMPLANT
GLOVE SURG SS PI 6.0 STRL IVOR (GLOVE) IMPLANT
GLOVE SURG SS PI 7.0 STRL IVOR (GLOVE) IMPLANT
GOWN STRL REUS W/ TWL LRG LVL3 (GOWN DISPOSABLE) IMPLANT
GOWN STRL REUS W/ TWL XL LVL3 (GOWN DISPOSABLE) ×1 IMPLANT
GOWN STRL REUS W/TWL 2XL LVL3 (GOWN DISPOSABLE) IMPLANT
GOWN STRL REUS W/TWL LRG LVL3 (GOWN DISPOSABLE)
GOWN STRL REUS W/TWL XL LVL3 (GOWN DISPOSABLE) ×1
HEMOSTAT POWDER KIT SURGIFOAM (HEMOSTASIS) IMPLANT
KIT BASIN OR (CUSTOM PROCEDURE TRAY) ×1 IMPLANT
KIT TURNOVER KIT B (KITS) ×1 IMPLANT
NDL SPNL 22GX3.5 QUINCKE BK (NEEDLE) ×1 IMPLANT
NEEDLE HYPO 22GX1.5 SAFETY (NEEDLE) ×1 IMPLANT
NEEDLE SPNL 22GX3.5 QUINCKE BK (NEEDLE) ×1 IMPLANT
NS IRRIG 1000ML POUR BTL (IV SOLUTION) ×1 IMPLANT
PACK LAMINECTOMY NEURO (CUSTOM PROCEDURE TRAY) ×1 IMPLANT
PAD ARMBOARD 7.5X6 YLW CONV (MISCELLANEOUS) ×3 IMPLANT
PIN MAYFIELD SKULL DISP (PIN) IMPLANT
SOL ELECTROSURG ANTI STICK (MISCELLANEOUS) ×1
SOLUTION ELECTROSURG ANTI STCK (MISCELLANEOUS) ×1 IMPLANT
SPIKE FLUID TRANSFER (MISCELLANEOUS) ×1 IMPLANT
SPONGE SURGIFOAM ABS GEL 100 (HEMOSTASIS) IMPLANT
SPONGE SURGIFOAM ABS GEL SZ50 (HEMOSTASIS) IMPLANT
STRIP CLOSURE SKIN 1/2X4 (GAUZE/BANDAGES/DRESSINGS) ×1 IMPLANT
SUT VIC AB 2-0 CT1 18 (SUTURE) ×1 IMPLANT
SUT VIC AB 3-0 SH 8-18 (SUTURE) ×1 IMPLANT
TOWEL GREEN STERILE (TOWEL DISPOSABLE) ×1 IMPLANT
TOWEL GREEN STERILE FF (TOWEL DISPOSABLE) ×1 IMPLANT
WATER STERILE IRR 1000ML POUR (IV SOLUTION) ×1 IMPLANT

## 2023-04-24 NOTE — Op Note (Signed)
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  Asst. Surgeon: Doran Durand, NP  Assistant utilized for exposure, decompression and closure.  Anesthesia: General  Indication: 37 year old female with interscapular pain with radiating numbness and some early symptoms of myelopathy.  Workup demonstrates evidence of a paracentral disc herniation at T3-4 with cord compression and syringomyelia below the level of compression.  Patient presents now for transpedicular microdiscectomy in hopes of improving her symptoms.  Operative note: After induction of anesthesia, patient position prone onto bolsters with her head fixed in [place] pin headrest.  Patient's upper thoracic region prepped and draped sterilely.  Localizing images were obtained using C arm fluoroscopy.  Incision made overlying T3-T4.  Dissection performed on the right.  Marker was placed on the T4 transverse process and this was confirmed by fluoroscopy.  Laminotomy was then performed using high-speed drill and Kerrison rongeurs to remove the inferior aspect of the lamina of L3 the entire L3-L4 facet joint and the superior rim of the L4 lamina on the right.  Ligament flavum elevated and resected.  Underlying thecal sac and exiting right T3 nerve root identified.  Microscope brought to the field used microsuction the spinal canal.  The pedicle of T4 was then resected using high-speed drill.  The disc space was identified and this incised.  The disc base was entered.  The disc base was widened slightly using the high-speed drill.  Disc material was removed from the interspace.  Then using curettes and other down pushing instruments posterior disc material was pushed into the interspace then removed including  the fragment of disc.  There was minimal traction placed upon the thecal sac.  There was no evidence of injury to the thecal sac spinal cord or nerve roots.  The wound was then irrigated.  Probe was passed easily beneath thecal sac without any evidence of any residual compression.  Gelfoam was placed over the laminotomy defect.  Wound was then closed in layers with Vicryl sutures.  Steri-Strips and sterile dressing were applied.  No apparent complications.  Patient tolerated the procedure well and she returns to the recovery room postop.

## 2023-04-24 NOTE — Anesthesia Preprocedure Evaluation (Addendum)
Anesthesia Evaluation  Patient identified by MRN, date of birth, ID band Patient awake    Reviewed: Allergy & Precautions, NPO status , Patient's Chart, lab work & pertinent test results  History of Anesthesia Complications Negative for: history of anesthetic complications  Airway Mallampati: III  TM Distance: >3 FB Neck ROM: Full    Dental no notable dental hx. (+) Dental Advisory Given, Teeth Intact   Pulmonary Patient abstained from smoking., former smoker   Pulmonary exam normal breath sounds clear to auscultation       Cardiovascular negative cardio ROS Normal cardiovascular exam Rhythm:Regular Rate:Normal     Neuro/Psych  Headaches PSYCHIATRIC DISORDERS Anxiety Depression Bipolar Disorder    Neuromuscular disease    GI/Hepatic negative GI ROS, Neg liver ROS,,,  Endo/Other  neg diabetes  Morbid obesity  Renal/GU negative Renal ROS     Musculoskeletal  (+) Arthritis ,    Abdominal  (+) + obese  Peds  Hematology   Anesthesia Other Findings   Reproductive/Obstetrics negative OB ROS                             Anesthesia Physical Anesthesia Plan  ASA: 2  Anesthesia Plan: General   Post-op Pain Management: Tylenol PO (pre-op)* and Gabapentin PO (pre-op)*   Induction: Intravenous  PONV Risk Score and Plan: 2 and Ondansetron and Dexamethasone  Airway Management Planned: Oral ETT  Additional Equipment: None  Intra-op Plan:   Post-operative Plan: Extubation in OR  Informed Consent: I have reviewed the patients History and Physical, chart, labs and discussed the procedure including the risks, benefits and alternatives for the proposed anesthesia with the patient or authorized representative who has indicated his/her understanding and acceptance.     Dental advisory given  Plan Discussed with: CRNA  Anesthesia Plan Comments:         Anesthesia Quick Evaluation

## 2023-04-24 NOTE — Anesthesia Procedure Notes (Signed)
Procedure Name: Intubation Date/Time: 04/24/2023 1:20 PM  Performed by: Darlina Guys, CRNAPre-anesthesia Checklist: Patient identified, Emergency Drugs available, Suction available and Patient being monitored Patient Re-evaluated:Patient Re-evaluated prior to induction Oxygen Delivery Method: Circle system utilized Preoxygenation: Pre-oxygenation with 100% oxygen Induction Type: IV induction Ventilation: Mask ventilation without difficulty Laryngoscope Size: Mac and 3 Grade View: Grade I Tube type: Oral Tube size: 7.0 mm Number of attempts: 1 Airway Equipment and Method: Stylet Placement Confirmation: ETT inserted through vocal cords under direct vision, positive ETCO2 and breath sounds checked- equal and bilateral Secured at: 21 cm Dental Injury: Teeth and Oropharynx as per pre-operative assessment

## 2023-04-24 NOTE — H&P (Signed)
Jill Cardenas is an 37 y.o. female.   Chief Complaint: Upper thoracic pain HPI: 37 year old female presents with pain in her interscapular region with progressive numbness along her chest wall and some lower extremity ataxia and subjective weakness.  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  Patient presents now for thoracic microdiscectomy in hopes of improving her symptoms.  Past Medical History:  Diagnosis Date   Anxiety 2017   Arthritis    OA   Bipolar disorder (HCC)    Cholecystitis    Depression    Headache    MIgraines - "more since neck started hurting."   Herniated nucleus pulposus, thoracic 01/2023   Leukocytosis 2021   Sciatica     Past Surgical History:  Procedure Laterality Date   ANKLE ARTHROSCOPY W/ OPEN REPAIR Right    ANTERIOR CERVICAL DECOMP/DISCECTOMY FUSION N/A 03/20/2020   Procedure: Anterior Cervical Decompression Discectomy Fusion - Cervical five-six;  Surgeon: Julio Sicks, MD;  Location: [facility] OR;  Service: Neurosurgery;  Laterality: N/A;   BACK SURGERY     lumbar fusion   CESAREAN SECTION     LUMBAR LAMINECTOMY/DECOMPRESSION MICRODISCECTOMY Left 11/05/2014   Procedure: LUMBAR LAMINECTOMY/DECOMPRESSION MICRODISCECTOMY 1 LEVEL LUMBAR 4-5;  Surgeon: Temple Pacini, MD;  Location: [facility] OR;  Service: Neurosurgery;  Laterality: Left;  LUMBAR LAMINECTOMY/DECOMPRESSION MICRODISCECTOMY 1 LEVEL LUMBAR 4-5   TUBAL LIGATION      Family History  Problem Relation Age of Onset   Emphysema Mother    Social History:  reports that she quit smoking about 9 months ago. Her smoking use included cigarettes. She has a 7.50 pack-year smoking history. She has never used smokeless tobacco. She reports that she does not drink alcohol and does not use drugs.  Allergies:  Allergies  Allergen Reactions   Latex Hives     Medications Prior to Admission  Medication Sig Dispense Refill   acetaminophen (TYLENOL) 500 MG tablet Take 1,000 mg by mouth daily as needed for mild pain.      gabapentin (NEURONTIN) 100 MG capsule Take 100 mg by mouth 3 (three) times daily.     hydrOXYzine (ATARAX/VISTARIL) 25 MG tablet Take 1 tablet (25 mg total) by mouth every 6 (six) hours as needed for anxiety. (Patient taking differently: Take 50 mg by mouth 3 (three) times daily.) 60 tablet 0   methocarbamol (ROBAXIN) 500 MG tablet Take 500 mg by mouth 2 (two) times daily.     traZODone (DESYREL) 50 MG tablet Take 50 mg by mouth at bedtime.     VRAYLAR 6 MG CAPS Take 6 mg by mouth at bedtime.      Results for orders placed or performed during the hospital encounter of 04/24/23 (from the past 48 hour(s))  Pregnancy, urine POC     Status: None   Collection Time: 04/24/23 11:45 AM  Result Value Ref Range   Preg Test, Ur NEGATIVE NEGATIVE    Comment:        THE SENSITIVITY OF THIS METHODOLOGY IS >24 mIU/mL    No results found.  Pertinent items noted in HPI and remainder of comprehensive ROS otherwise negative.  Blood pressure 132/77, pulse 91, temperature 98.2 F (36.8 C), temperature source Oral, resp. rate 18, height 5\' 3"  (1.6 m), weight 90.7 kg, last menstrual period 04/24/2023, SpO2 95 %.  She is awake and alert.  She is oriented and appropriate.  Speech is fluent.  Judgment insight are intact.  Cranial nerve function normal bilateral.  Motor examination reveals intact motor strength bilaterally sensory examination with some relative decrease sensation in her upper and mid thoracic region.  Reflexes are brisk in both lower extremities.  Gait is somewhat spastic.  Examination head ears eyes nose and throat is unremarked.  Chest and abdomen are benign.  Extremities are free of major deformity. 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  Plan right T3-T4 laminotomy  and transpedicular microdiscectomy.  Risks and benefits been explained.  Patient wishes to proceed.  Kathaleen Maser Woodruff Skirvin 04/24/2023, 12:19 PM

## 2023-04-24 NOTE — Transfer of Care (Signed)
Immediate Anesthesia Transfer of Care Note  Patient: Jill Cardenas  Procedure(s) Performed: Microdiscectomy - right - Thoracic three-Thoracic four (Right: Back)  Patient Location: PACU  Anesthesia Type:General  Level of Consciousness: awake and oriented  Airway & Oxygen Therapy: Patient Spontanous Breathing and Patient connected to face mask oxygen  Post-op Assessment: Report given to RN and Post -op Vital signs reviewed and stable  Post vital signs: Reviewed and stable  Last Vitals:  Vitals Value Taken Time  BP 115/81 04/24/23 1534  Temp    Pulse 91 04/24/23 1544  Resp 18 04/24/23 1544  SpO2 100 % 04/24/23 1544  Vitals shown include unvalidated device data.  Last Pain:  Vitals:   04/24/23 1123  TempSrc:   PainSc: 5          Complications: There were no known notable events for this encounter.

## 2023-04-24 NOTE — Brief Op Note (Signed)
04/24/2023  3:18 PM  PATIENT:  Jill Cardenas  37 y.o. female  PRE-OPERATIVE DIAGNOSIS:  HNP  POST-OPERATIVE DIAGNOSIS:  HNP  PROCEDURE:  Procedure(s): Microdiscectomy - right - Thoracic three-Thoracic four (Right)  SURGEON:  Surgeon(s) and Role:    * Julio Sicks, MD - Primary  PHYSICIAN ASSISTANT:   ASSISTANTSMarland Mcalpine   ANESTHESIA:   general  EBL:  100cc   BLOOD ADMINISTERED:none  DRAINS: none   LOCAL MEDICATIONS USED:  MARCAINE     SPECIMEN:  No Specimen  DISPOSITION OF SPECIMEN:  N/A  COUNTS:  YES  TOURNIQUET:  * No tourniquets in log *  DICTATION: .Dragon Dictation  PLAN OF CARE: Admit for overnight observation  PATIENT DISPOSITION:  PACU - hemodynamically stable.   Delay start of Pharmacological VTE agent (>24hrs) due to surgical blood loss or risk of bleeding: yes

## 2023-04-25 ENCOUNTER — Encounter (HOSPITAL_COMMUNITY): Payer: Self-pay | Admitting: Neurosurgery

## 2023-04-25 DIAGNOSIS — M5104 Intervertebral disc disorders with myelopathy, thoracic region: Secondary | ICD-10-CM | POA: Diagnosis not present

## 2023-04-25 MED ORDER — HYDROCODONE-ACETAMINOPHEN 10-325 MG PO TABS: 1.0000 | ORAL_TABLET | ORAL | 0 refills | Status: AC | PRN

## 2023-04-25 NOTE — Progress Notes (Signed)
Patient alert and oriented, mae's well, voiding adequate amount of urine, swallowing without difficulty, no c/o pain at time of discharge. Patient discharged home with family. Script and discharged instructions given to patient. Patient and family stated understanding of instructions given. Patient has an appointment with Dr. Pool  

## 2023-04-25 NOTE — Discharge Instructions (Addendum)
Wound Care Keep incision covered and dry for three days.   Do not put any creams, lotions, or ointments on incision. Leave steri-strips on back.  They will fall off by themselves. Activity Walk each and every day, increasing distance each day. No lifting greater than 8 lbs.  Avoid excessive neck motion. No driving for 2 weeks; may ride as a passenger locally.  Diet Resume your normal diet.   Call Your Doctor If Any of These Occur Redness, drainage, or swelling at the wound.  Temperature greater than 101 degrees. Severe pain not relieved by pain medication. Incision starts to come apart. Follow Up Appt Call [phone])  for problems.

## 2023-04-25 NOTE — Anesthesia Postprocedure Evaluation (Signed)
Anesthesia Post Note  Patient: Jill Cardenas  Procedure(s) Performed: Microdiscectomy - right - Thoracic three-Thoracic four (Right: Back)     Patient location during evaluation: PACU Anesthesia Type: General Level of consciousness: sedated and patient cooperative Pain management: pain level controlled Vital Signs Assessment: post-procedure vital signs reviewed and stable Respiratory status: spontaneous breathing Cardiovascular status: stable Anesthetic complications: no   There were no known notable events for this encounter.  Last Vitals:  Vitals:   04/25/23 0505 04/25/23 0737  BP: 116/67 111/76  Pulse: 86 99  Resp: 20 16  Temp: 37.3 C 36.6 C  SpO2: 97% 100%    Last Pain:  Vitals:   04/25/23 0505  TempSrc: Oral  PainSc: 7                  Lewie Loron

## 2023-04-25 NOTE — Evaluation (Signed)
Occupational Therapy Evaluation and Discharge Patient Details Name: Jill Cardenas MRN: [id number] DOB: 1986/06/12 Today's Date: 04/25/2023   History of Present Illness Jill Cardenas is a 37 yo female s/p microdiscectomy - right -T 3-4 due to pain in her interscapular region with progressive numbness along her chest wall and some lower extremity ataxia and subjective weakness.  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   Clinical Impression   This 36 yo female admitted and underwent above presents to acute OT with all education completed with pt and husband as well as post op back handout provided. No further OT needs, we will sign off.      Recommendations for follow up therapy are one component of a multi-disciplinary discharge planning process, led by the attending physician.  Recommendations may be updated based on patient status, additional functional criteria and insurance authorization.   Assistance Recommended at Discharge PRN  Patient can return home with the following Assistance with cooking/housework;Assist for transportation    Functional Status Assessment  Patient has had a recent decline in their functional status and demonstrates the ability to make significant improvements in function in a reasonable and predictable amount of time. (all education completed with patient and husband, no need for further skilled OT services. Post op surgical back handout provided)  Equipment Recommendations  None recommended by OT       Precautions / Restrictions Precautions Precautions: Back Precaution Booklet Issued: Yes (comment) Required Braces or Orthoses:  (no brace per chart) Restrictions Weight Bearing Restrictions: No      Mobility Bed Mobility Overal bed mobility: Modified Independent                   Transfers Overall transfer level: Modified independent Equipment used: None                      Balance Overall balance assessment: Mild deficits observed, not formally tested                                         ADL either performed or assessed with clinical judgement   ADL Overall ADL's : Modified independent                                       General ADL Comments: Educated on dressing, sit<>stand stance to keep back more straight, use of cups for brushing teeth to avoid bending over the sink, starting with sitting 20-30 minutes building up to 1 hour, use of pillows for positioning in bed, not sitting in a recliner at a 90 degree hip to back angle (her's is electric), use of wet wipes for back peri care     Vision Patient Visual Report: No change from baseline              Pertinent Vitals/Pain Pain Assessment Pain Assessment: 0-10 Pain Score: 6  Pain Location: incisional Pain Descriptors / Indicators: Aching, Sore Pain Intervention(s): Limited activity within patient's tolerance, Monitored during session, Repositioned, Patient requesting pain meds-RN notified, RN gave pain meds during session     Hand Dominance Right   Extremity/Trunk Assessment Upper Extremity Assessment  Upper Extremity Assessment: Overall WFL for tasks assessed           Communication Communication Communication: No difficulties   Cognition Arousal/Alertness: Awake/alert Behavior During Therapy: WFL for tasks assessed/performed Overall Cognitive Status: Within Functional Limits for tasks assessed                                                  Home Living Family/patient expects to be discharged to:: Private residence Living Arrangements: Spouse/significant other;Children Available Help at Discharge: Family;Available 24 hours/day Type of Home: House Home Access: Level entry     Home Layout: One level      Bathroom Shower/Tub: Tub/shower unit;Curtain   Firefighter: Handicapped height     Home Equipment: Hand held shower head          Prior Functioning/Environment Prior Level of Function : Independent/Modified Independent                        OT Problem List: Decreased range of motion;Pain         OT Goals(Current goals can be found in the care plan section) Acute Rehab OT Goals Patient Stated Goal: home today         AM-PAC OT "6 Clicks" Daily Activity     Outcome Measure Help from another person eating meals?: None Help from another person taking care of personal grooming?: None Help from another person toileting, which includes using toliet, bedpan, or urinal?: None Help from another person bathing (including washing, rinsing, drying)?: None Help from another person to put on and taking off regular upper body clothing?: None Help from another person to put on and taking off regular lower body clothing?: None 6 Click Score: 24   End of Session Nurse Communication:  (pt ready to go from OT standpoint)  Activity Tolerance: Patient tolerated treatment well Patient left:  (sitting EOB)  OT Visit Diagnosis: Pain Pain - part of body:  (back)                Time: 6962-9528 OT Time Calculation (min): 19 min Charges:  OT General Charges $OT Visit: 1 Visit OT Evaluation $OT Eval Moderate Complexity: 1 Mod Jill L. OT Acute Rehabilitation Services Office [phone]    Jill Cardenas 04/25/2023, 9:38 AM

## 2023-04-25 NOTE — Discharge Summary (Signed)
Physician Discharge Summary  Patient ID: Jill Cardenas MRN: [id number] DOB/AGE: November 08, 1986 37 y.o.  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  Postoperative doing very well.  Preoperative symptoms resolved.  Standing ambulating and voiding without difficulty.  Ready for discharge home.  Consults:   Significant Diagnostic Studies:   Treatments:   Discharge Exam: Blood pressure 111/76, pulse 99, temperature 97.8 F (36.6 C), resp. rate 16, height 5\' 3"  (1.6 m), weight 90.7 kg, last menstrual period 04/24/2023, SpO2 100 %. Awake and alert.  Oriented and appropriate.  Motor and sensory function intact.  Wound clean and dry.  Chest and abdomen benign.  Disposition: Discharge disposition: 01-Home or Self Care        Allergies as of 04/25/2023       Reactions   Latex Hives        Medication List     TAKE these medications    acetaminophen 500 MG tablet Commonly known as: TYLENOL Take 1,000 mg by mouth daily as needed for mild pain.   gabapentin 100 MG capsule Commonly known as: NEURONTIN Take 100 mg by mouth 3 (three) times daily.   HYDROcodone-acetaminophen 10-325 MG tablet Commonly known as: NORCO Take 1-2 tablets by mouth every 4 (four) hours as needed for severe pain ((score 7 to 10)).   hydrOXYzine 25 MG tablet Commonly known as: ATARAX Take 1 tablet (25 mg total) by mouth every 6 (six) hours as needed for anxiety. What changed:  how much to take when to take this   methocarbamol 500 MG tablet Commonly known as: ROBAXIN Take 500 mg by mouth 2 (two) times daily.   traZODone 50 MG tablet Commonly known as: DESYREL Take 50 mg by mouth at bedtime.   Vraylar 6 MG  Caps Generic drug: Cariprazine HCl Take 6 mg by mouth at bedtime.         SignedKathaleen Maser Laythan Hayter 04/25/2023, 8:24 AM

## 2023-04-25 NOTE — Plan of Care (Signed)
